# Patient Record
Sex: Female | Born: 1975 | Race: White | Hispanic: No | Marital: Married | Smoking: Never smoker
Health system: Southern US, Community
[De-identification: ages and names within clinical notes are randomized; demographics above are authoritative.]

## PROBLEM LIST (undated history)

## (undated) DIAGNOSIS — G8929 Other chronic pain: Secondary | ICD-10-CM

## (undated) DIAGNOSIS — R39198 Other difficulties with micturition: Secondary | ICD-10-CM

## (undated) DIAGNOSIS — Z8659 Personal history of other mental and behavioral disorders: Secondary | ICD-10-CM

## (undated) DIAGNOSIS — M549 Dorsalgia, unspecified: Secondary | ICD-10-CM

## (undated) DIAGNOSIS — E785 Hyperlipidemia, unspecified: Secondary | ICD-10-CM

## (undated) HISTORY — PX: WISDOM TOOTH EXTRACTION: SHX21

## (undated) HISTORY — PX: COLONOSCOPY: SHX174

## (undated) HISTORY — DX: Hyperlipidemia, unspecified: E78.5

## (undated) HISTORY — PX: SPINE SURGERY: SHX786

---

## 2003-12-17 ENCOUNTER — Ambulatory Visit (HOSPITAL_COMMUNITY): Admission: RE | Admit: 2003-12-17 | Discharge: 2003-12-17 | Payer: Self-pay | Admitting: Emergency Medicine

## 2003-12-17 IMAGING — CR DG KNEE COMPLETE 4+V*L*
4 series · 4 of 4 positions shown · non-contrast
Comparison: none

CLINICAL DATA: Left leg pain.  No injury. 
 LEFT KNEE FOUR VIEWS
 Four views of the left knee were obtained.  There is some loss of joint space medially.  No fracture is seen and no effusion is noted.
 IMPRESSION
 Some loss of joint space medially.  No acute abnormality.
 LEFT TIBIA/FIBULA TWO VIEWS
 Two views of the left tibia and fibula show no acute abnormality.  Alignment is normal.
 Negative left tibia/fibula.

[view not recorded (1 of 4)]
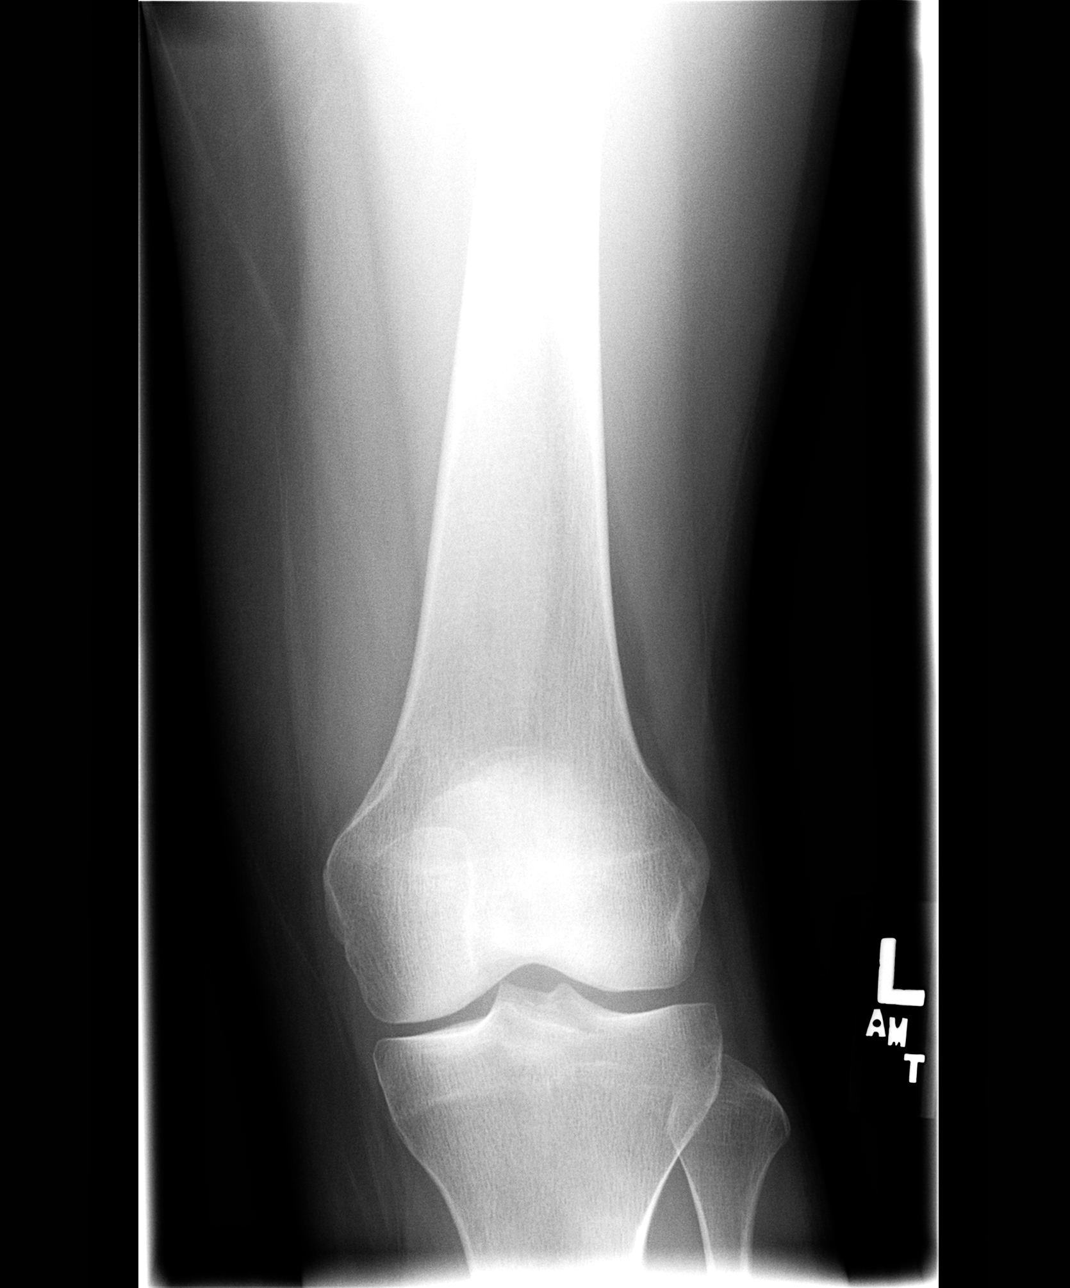

[view not recorded (2 of 4)]
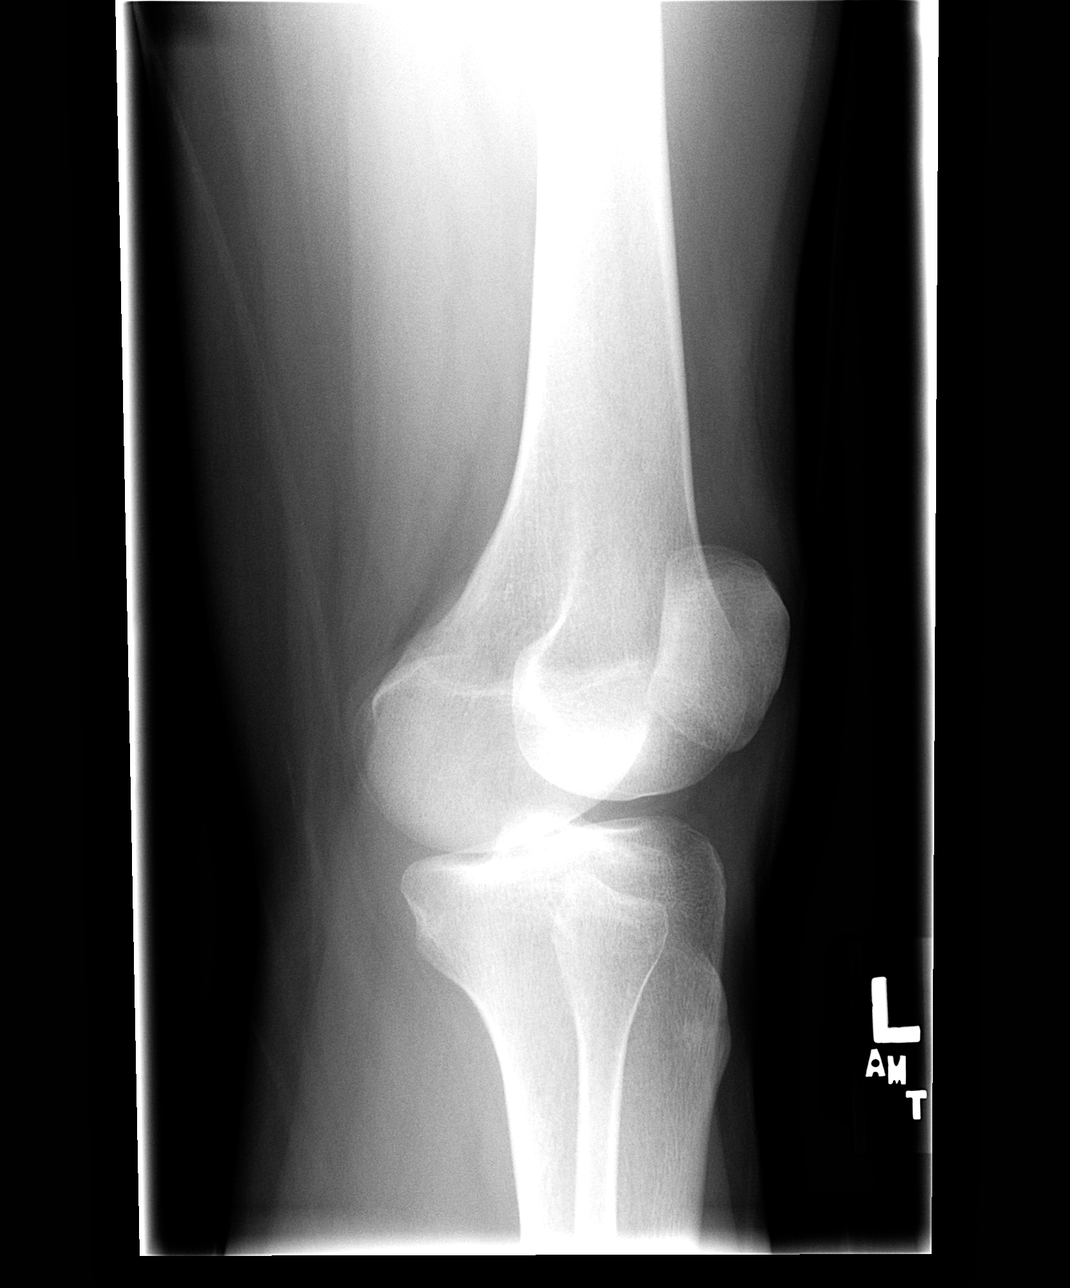

[view not recorded (3 of 4)]
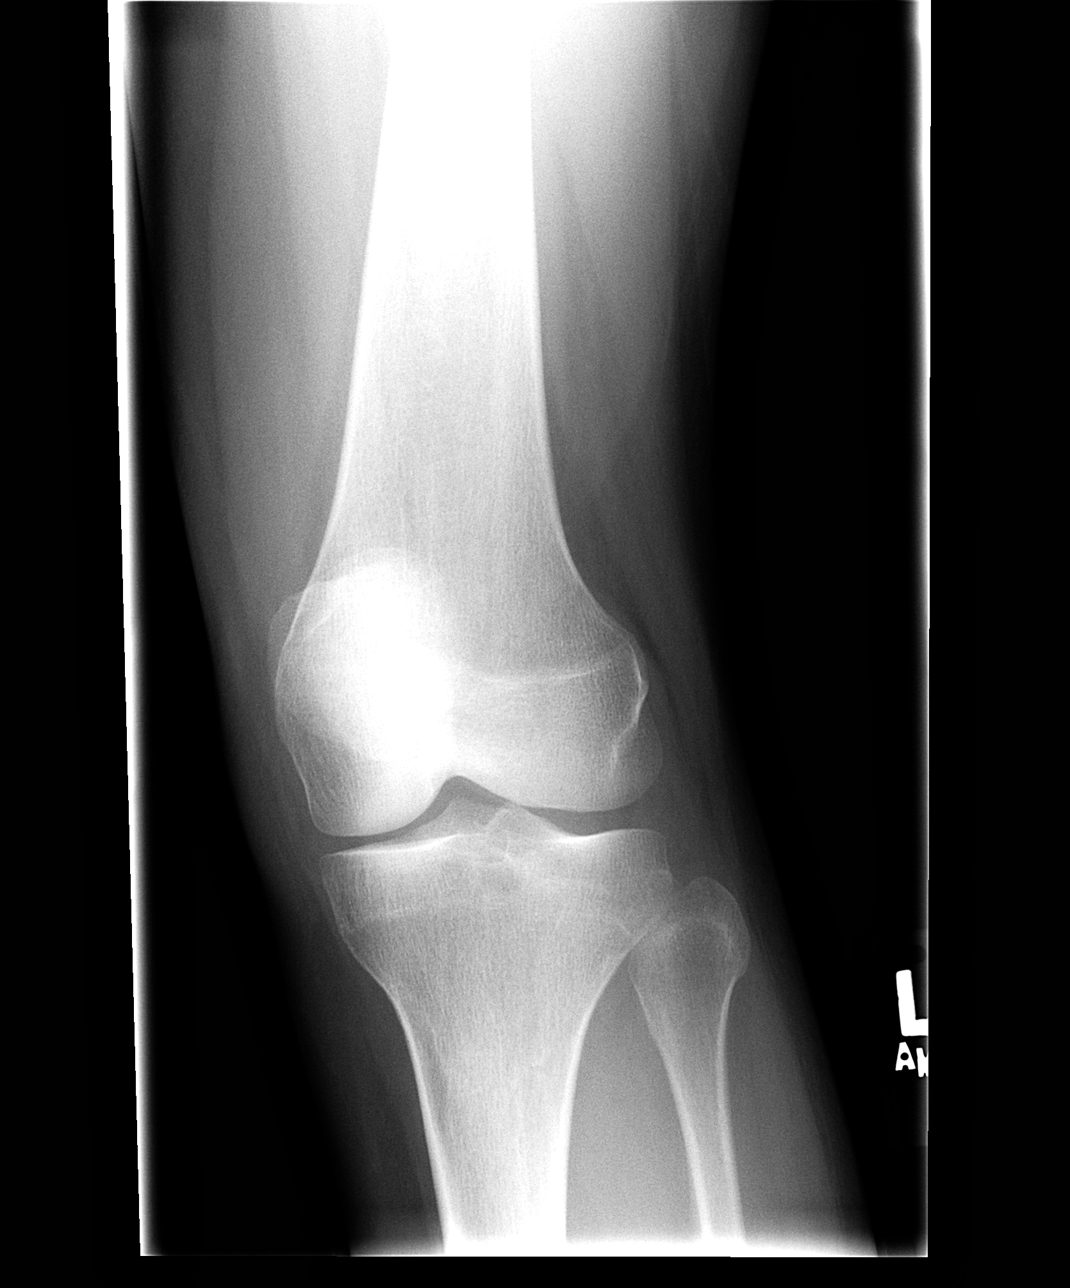

[view not recorded (4 of 4)]
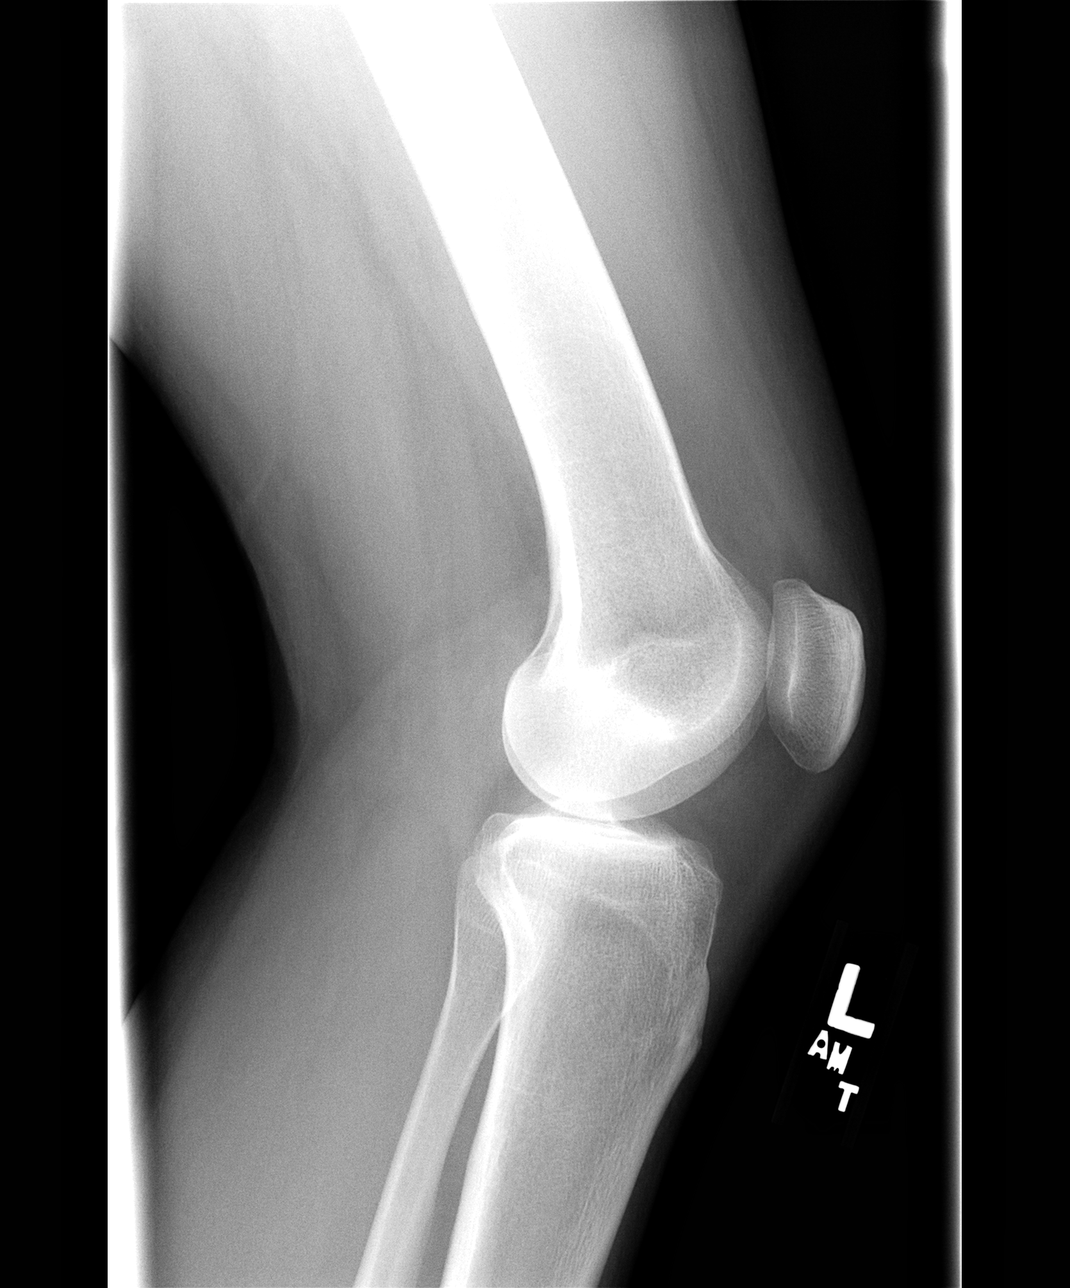

[4 of 4 positions shown; findings below may reference images not displayed]

## 2003-12-17 IMAGING — CR DG TIBIA/FIBULA 2V*L*
2 series · 2 of 2 positions shown · non-contrast
Comparison: none

CLINICAL DATA: Left leg pain.  No injury. 
 LEFT KNEE FOUR VIEWS
 Four views of the left knee were obtained.  There is some loss of joint space medially.  No fracture is seen and no effusion is noted.
 IMPRESSION
 Some loss of joint space medially.  No acute abnormality.
 LEFT TIBIA/FIBULA TWO VIEWS
 Two views of the left tibia and fibula show no acute abnormality.  Alignment is normal.
 Negative left tibia/fibula.

[view not recorded (1 of 2)]
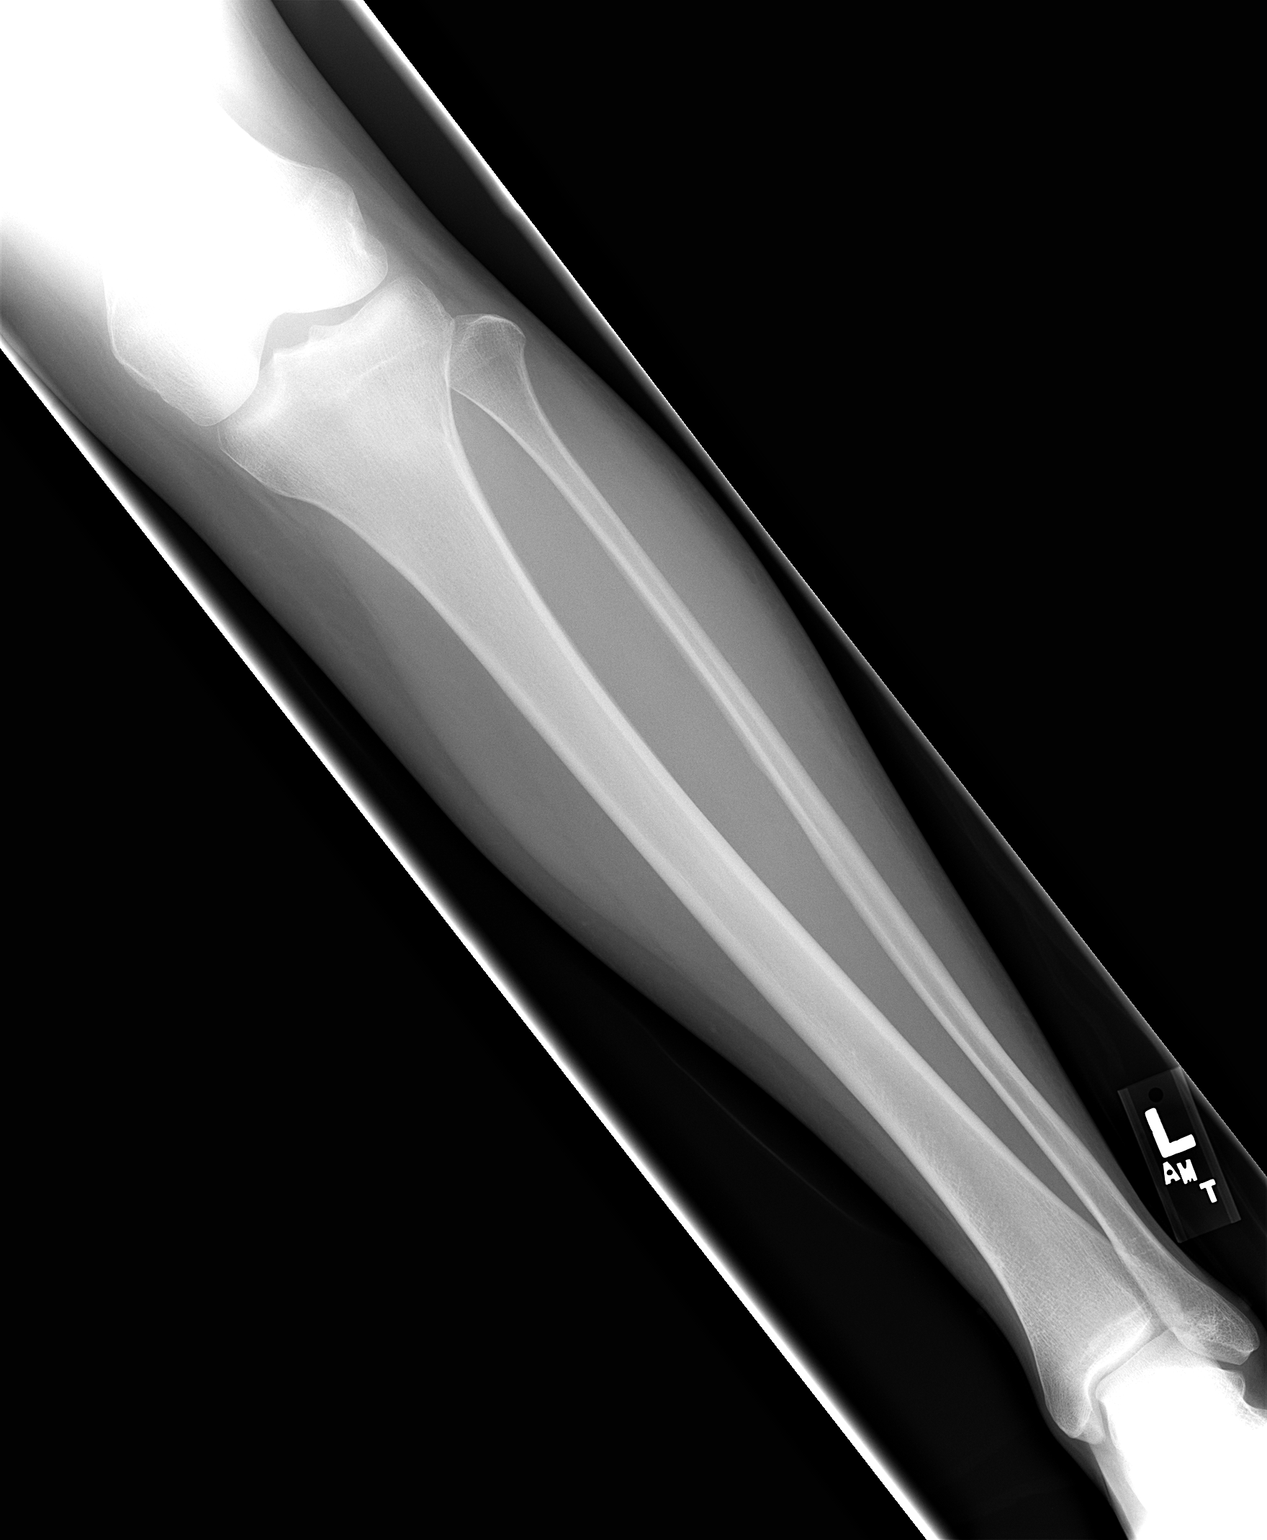

[view not recorded (2 of 2)]
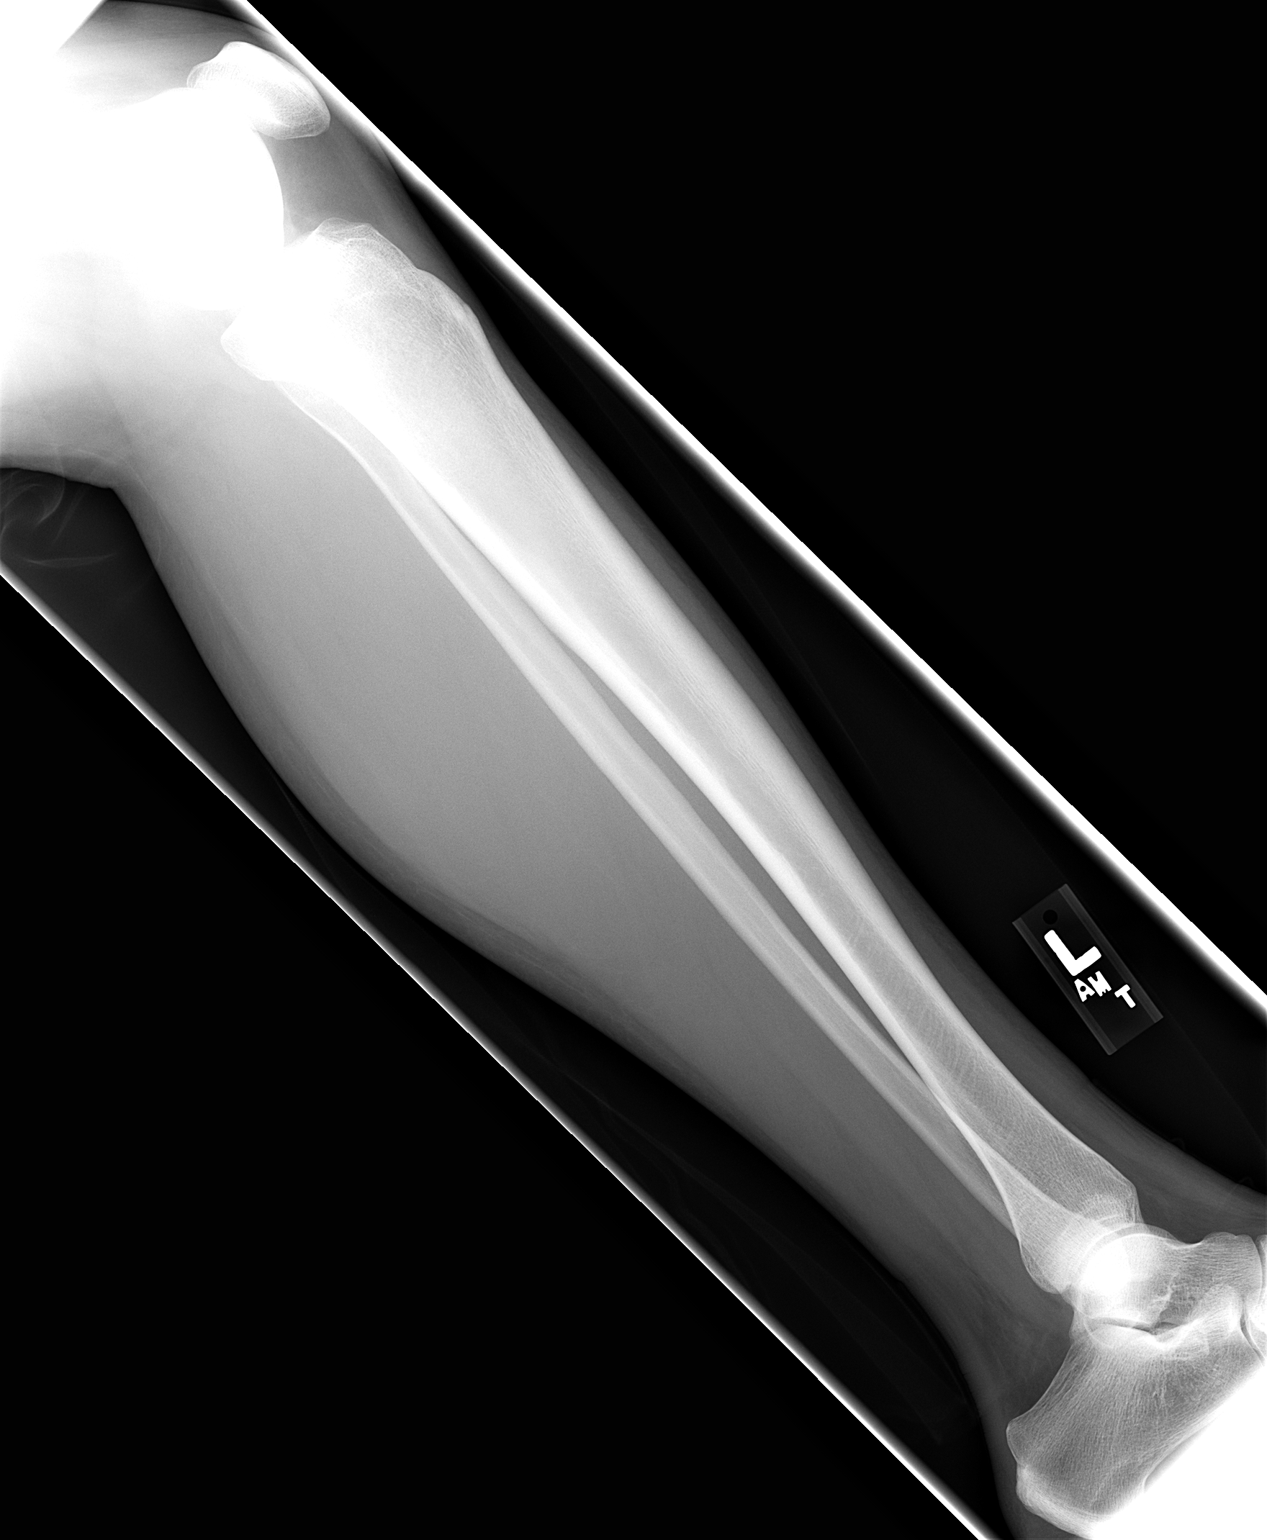

[2 of 2 positions shown; findings below may reference images not displayed]

## 2006-07-26 HISTORY — PX: CYSTECTOMY: SUR359

## 2008-03-26 ENCOUNTER — Encounter: Admission: RE | Admit: 2008-03-26 | Discharge: 2008-03-26 | Payer: Self-pay | Admitting: Gastroenterology

## 2008-03-26 IMAGING — CT CT ABDOMEN W/ CM
2 of 5 series · 17 of 46 positions shown, 19 images · IV contrast (READICAT/WATER & [ID] OMNI 300)
Comparison: None

CT ABDOMEN

CLINICAL DATA: Abdominal pain

CT ABDOMEN AND PELVIS WITH CONTRAST
TECHNIQUE: Multidetector CT imaging of the abdomen and pelvis was
performed using the standard protocol following bolus
administration of intravenous contrast.
Contrast: 100 ml of omni 300

[Series 3: routine abdomen · axial · 0.70mm/px · z∈[-342,+18]mm · 14 of 81 slices shown, 16 images]
[im 5/81  soft-tissue]
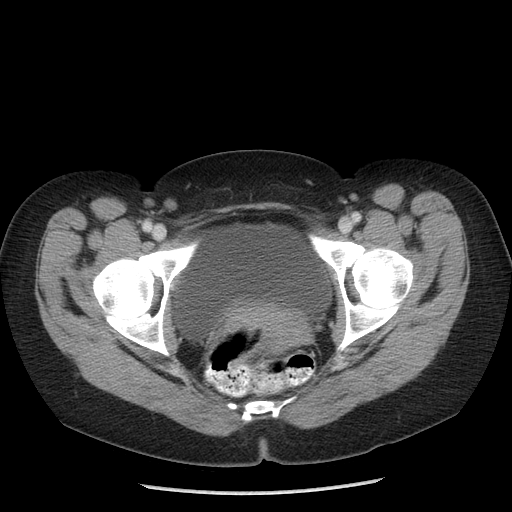
[im 5/81  bone]
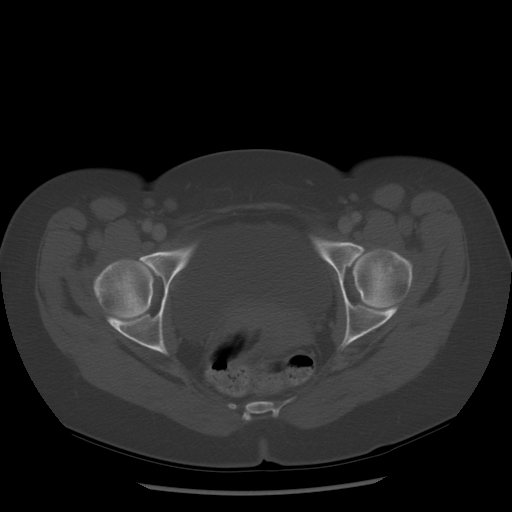
[im 9/81  soft-tissue]
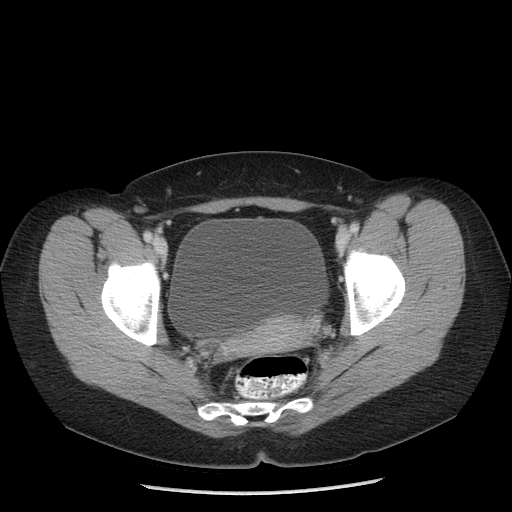
[im 17/81  soft-tissue]
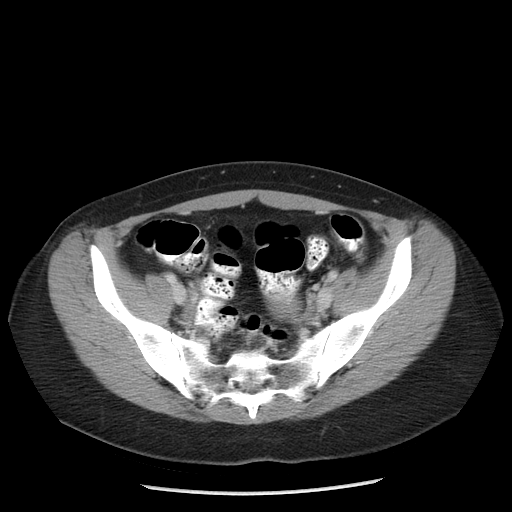
[im 22/81  soft-tissue]
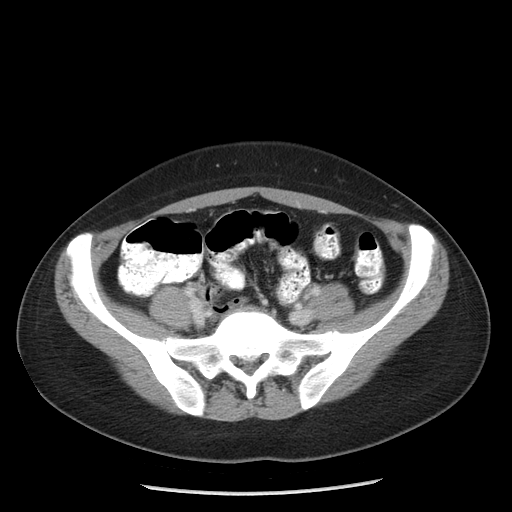
[im 26/81  soft-tissue]
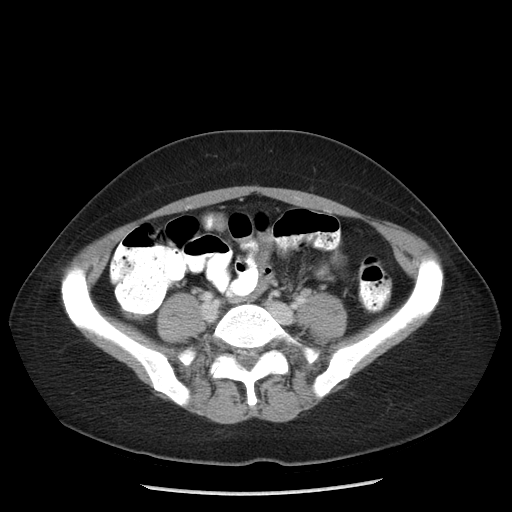
[im 34/81  soft-tissue]
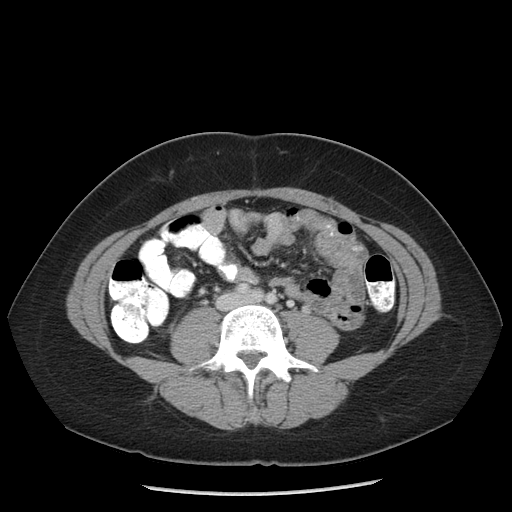
[im 38/81  soft-tissue]
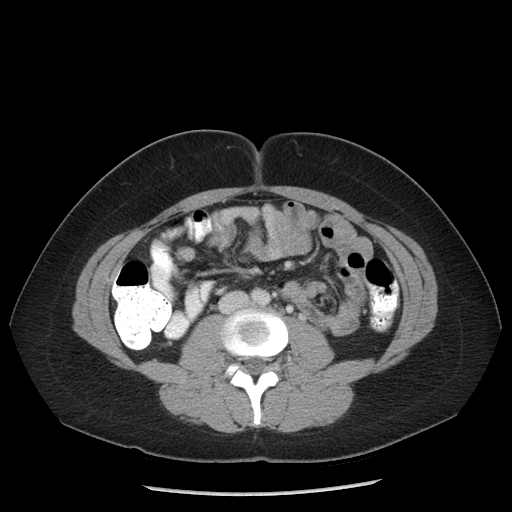
[im 43/81  soft-tissue]
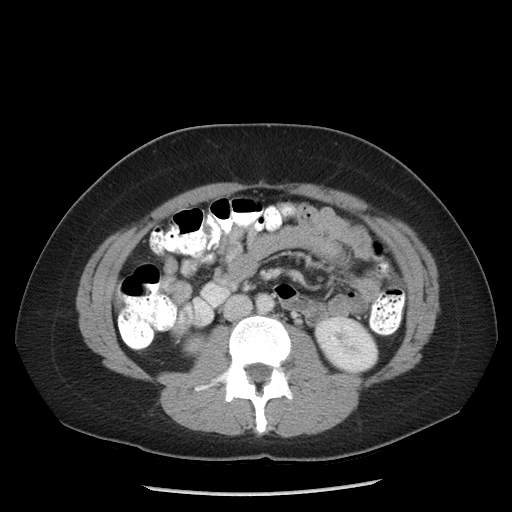
[im 47/81  soft-tissue]
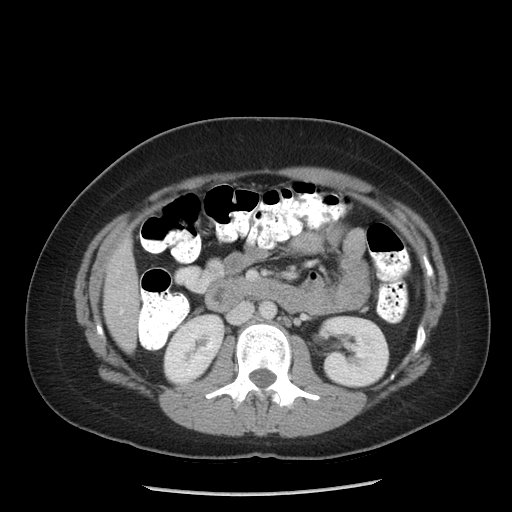
[im 47/81  bone]
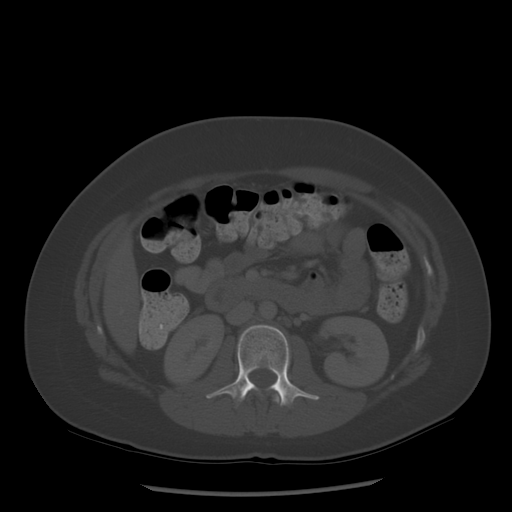
[im 55/81  soft-tissue]
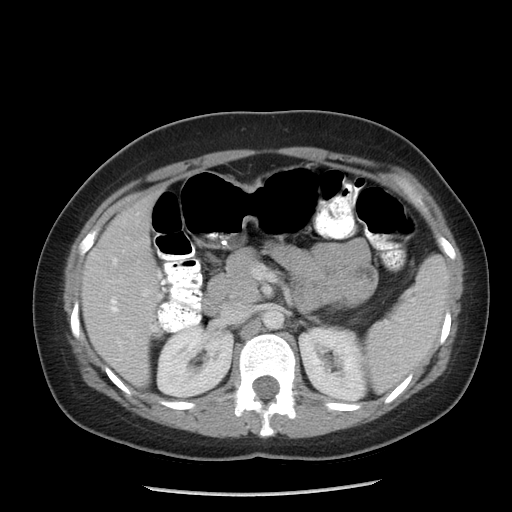
[im 59/81  soft-tissue]
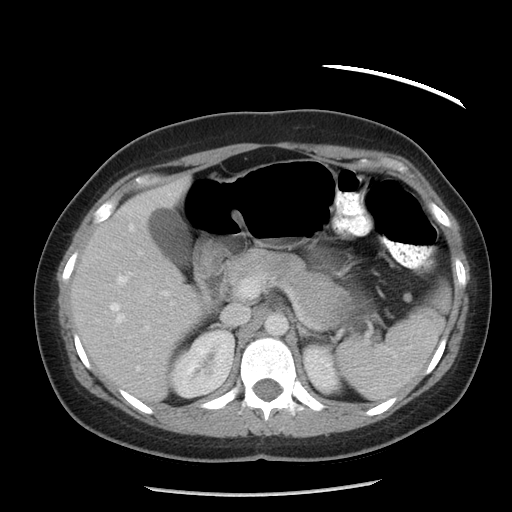
[im 64/81  soft-tissue]
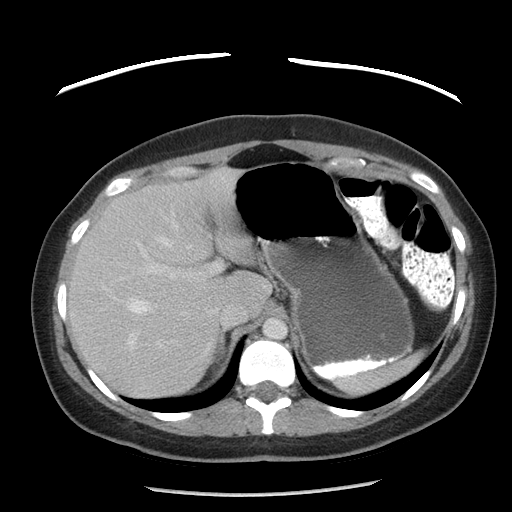
[im 72/81  soft-tissue]
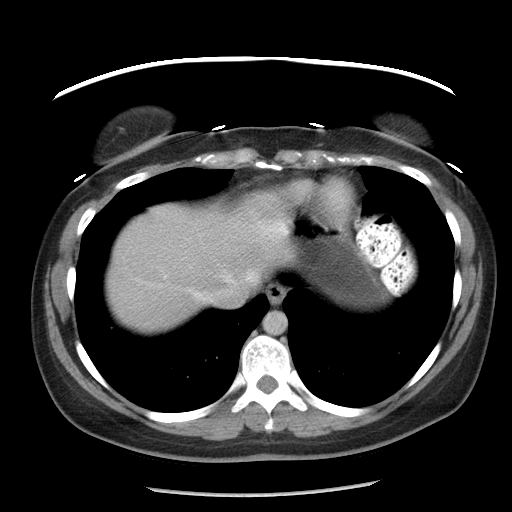
[im 76/81  soft-tissue]
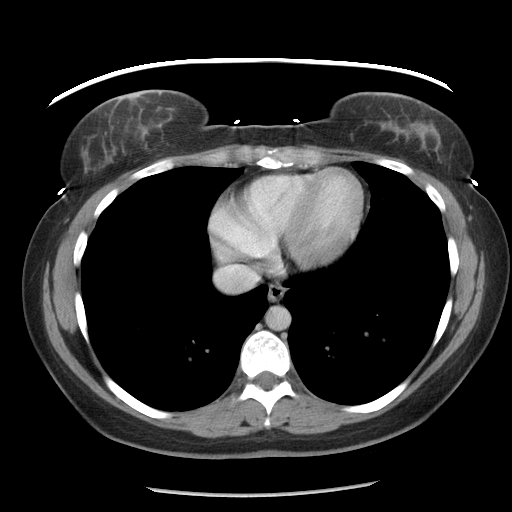

[Series 602: sagittal body · sagittal · 0.93mm/px · 3 of 145 slices shown]
[im 49/145  soft-tissue]
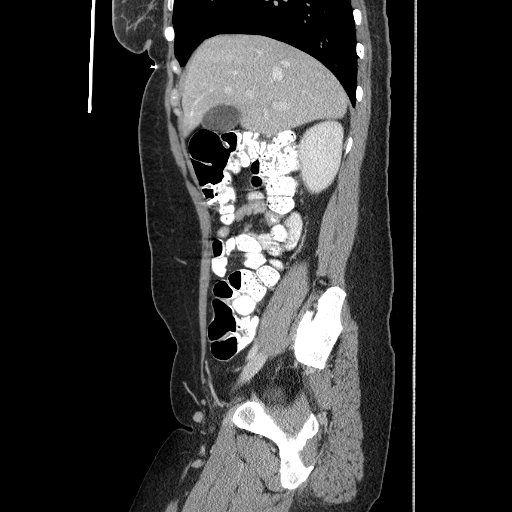
[im 65/145  soft-tissue]
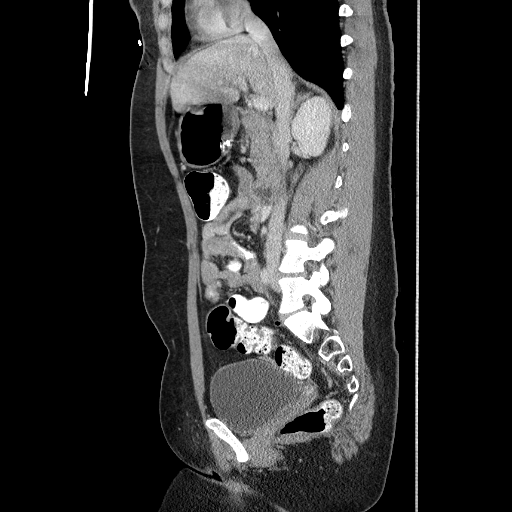
[im 81/145  soft-tissue]
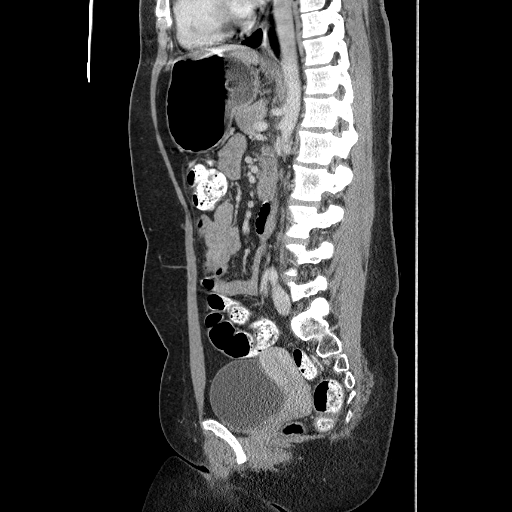

[17 of 46 positions shown; findings below may reference images not displayed]

FINDINGS: Visualized lung bases are clear.

Liver is normal.

Spleen is normal.

The pancreas is normal.

The adrenal glands are normal.

There is a simple appearing cyst within the right kidney measuring
9 mm, image 32.  A nonobstructing stone is identified within the
upper pole the left kidney measuring 3 mm, image 29.

Gallbladder is unremarkable by CT.

No biliary ductal dilation.

Stomach and visualized large and small bowel are unremarkable.

Abdominal aorta normal is in caliber.

No significant lymphadenopathy.

No free fluid or abnormal fluid collections..
IMPRESSION: 1.  No acute upper abdominal CT findings.
2.  Nonobstructing left renal calculi.

CT PELVIS
FINDINGS: Appendix identified and normal.

Visualized colon and small bowel are unremarkable.

No free fluid or abnormal fluid collections.

No significant lymphadenopathy.

Urinary bladder is normal.

There is no evidence for bowel obstruction.

A normal amount of stool is seen within the colon and rectum
without specific features to suggest constipation.
IMPRESSION: 1.  Normal pelvis CT.

## 2008-08-29 ENCOUNTER — Encounter (INDEPENDENT_AMBULATORY_CARE_PROVIDER_SITE_OTHER): Payer: Self-pay | Admitting: General Surgery

## 2008-08-29 ENCOUNTER — Ambulatory Visit (HOSPITAL_BASED_OUTPATIENT_CLINIC_OR_DEPARTMENT_OTHER): Admission: RE | Admit: 2008-08-29 | Discharge: 2008-08-29 | Payer: Self-pay | Admitting: General Surgery

## 2009-07-02 ENCOUNTER — Inpatient Hospital Stay (HOSPITAL_COMMUNITY): Admission: RE | Admit: 2009-07-02 | Discharge: 2009-07-04 | Payer: Self-pay | Admitting: Obstetrics and Gynecology

## 2010-10-27 LAB — COMPREHENSIVE METABOLIC PANEL
ALT: 10 U/L (ref 0–35)
ALT: 12 U/L (ref 0–35)
AST: 29 U/L (ref 0–37)
Albumin: 1.9 g/dL — ABNORMAL LOW (ref 3.5–5.2)
Alkaline Phosphatase: 106 U/L (ref 39–117)
BUN: 3 mg/dL — ABNORMAL LOW (ref 6–23)
CO2: 19 mEq/L (ref 19–32)
CO2: 24 mEq/L (ref 19–32)
Calcium: 8 mg/dL — ABNORMAL LOW (ref 8.4–10.5)
Calcium: 8.8 mg/dL (ref 8.4–10.5)
Chloride: 99 mEq/L (ref 96–112)
Creatinine, Ser: 0.48 mg/dL (ref 0.4–1.2)
GFR calc non Af Amer: >60 mL/min (ref >60)
Glucose, Bld: 96 mg/dL (ref 70–99)
Sodium: 130 mEq/L — ABNORMAL LOW (ref 135–145)
Sodium: 132 mEq/L — ABNORMAL LOW (ref 135–145)
Total Bilirubin: 0.6 mg/dL (ref 0.3–1.2)
Total Protein: 6.7 g/dL (ref 6.0–8.3)

## 2010-10-27 LAB — URINALYSIS, ROUTINE W REFLEX MICROSCOPIC
Bilirubin Urine: NEGATIVE
Protein, ur: NEGATIVE mg/dL
pH: 7 (ref 5.0–8.0)

## 2010-10-27 LAB — CBC
HCT: 25 % — ABNORMAL LOW (ref 36.0–46.0)
MCHC: 34 g/dL (ref 30.0–36.0)
MCHC: 34.2 g/dL (ref 30.0–36.0)
MCV: 90.5 fL (ref 78.0–100.0)
Platelets: 157 10*3/uL (ref 150–400)
Platelets: 217 10*3/uL (ref 150–400)
RBC: 2.73 MIL/uL — ABNORMAL LOW (ref 3.87–5.11)
RBC: 4.32 MIL/uL (ref 3.87–5.11)

## 2010-10-27 LAB — URINE MICROSCOPIC-ADD ON

## 2010-10-27 LAB — LACTATE DEHYDROGENASE: LDH: 158 U/L (ref 94–250)

## 2010-11-10 LAB — POCT HEMOGLOBIN-HEMACUE: Hemoglobin: 15.2 g/dL — ABNORMAL HIGH (ref 12.0–15.0)

## 2010-12-08 NOTE — Op Note (Signed)
NAME:  CURTIS, URIARTE                  ACCOUNT NO.:  [id number]   MEDICAL RECORD NO.:  [id number]          PATIENT TYPE:  AMB   LOCATION:  DSC                          FACILITY:  [facility]   PHYSICIAN:  Juanetta Gosling, MDDATE OF BIRTH:  03-Mar-1976   DATE OF PROCEDURE:  DATE OF DISCHARGE:                               OPERATIVE REPORT   PREOPERATIVE DIAGNOSIS:  Right perineal sebaceous cyst.   POSTOPERATIVE DIAGNOSIS:  Right perineal sebaceous cyst.   PROCEDURE:  Right perineal sebaceous cyst, excisional biopsy.   SURGEON:  Troy Sine. Dwain Sarna, MD.   ASSISTANT:  None.   ANESTHESIA:  General.   SPECIMENS:  Sebaceous cyst and surrounding tissue to Pathology.   ESTIMATED BLOOD LOSS:  Minimal.   COMPLICATIONS:  None.   DRAINS:  None.   DISPOSITION:  To recovery room in stable condition.   Ms. 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  She is referred from Dr. Elmon Else for a surgical excision.  On exam of her perineum, she had  evidence of prior infected sebaceous cyst of about 1.5 cm size mass.  We  discussed an excisional biopsy in the operating room.   DESCRIPTION OF PROCEDURE:  After informed consent was obtained, the  patient was then taken to the operating room.  She and I both identified  this area prior to operation.  She did state it increased considerably  since the last visit, but we were both able to feel a several millimeter  nodule next to a prior incision, so we did decide to proceed with a  surgery today.  She was placed under general anesthesia without  complication.  Her right perineum was then prepped and draped in a  standard sterile surgical fashion.  A surgical timeout was then  performed.  An elliptical incision overlying the mass as well as encompassing her  prior incision was then made and the tissue was  removed down to the  level of her muscle.  All tissue that felt like was indurated or felt  like a cyst was removed and upon completion all of the tissue  surrounding this was completely normal.  Irrigation was performed.  Hemostasis was observed.  I closed the dermis with 4-0 Vicryl and then  skin with 4-0 Monocryl.  Steri-Strips were placed over this.  I  infiltrated the wound with 0.25% Marcaine following this and then a  dressing was placed.  She was awakened in the operating room and  transferred to the recovery room in stable condition.      Juanetta Gosling, MD  Electronically Signed     MCW/MEDQ  D:  08/29/2008  T:  08/29/2008  Job:  [id number]   cc:   Christianne Dolin, MD

## 2011-02-09 LAB — RUBELLA ANTIBODY, IGM: Rubella: IMMUNE

## 2011-02-09 LAB — ABO/RH: RH Type: POSITIVE

## 2011-02-09 LAB — ANTIBODY SCREEN: Antibody Screen: NEGATIVE

## 2011-02-23 LAB — GC/CHLAMYDIA PROBE AMP, GENITAL
Chlamydia: NEGATIVE
Gonorrhea: NEGATIVE

## 2011-07-27 NOTE — L&D Delivery Note (Signed)
Delivery Note At 12:15 AM a viable female was delivered via VBAC, Spontaneous (Presentation: ; Occiput Anterior).  APGAR: 8, 9; weight 6 lb 11.9 oz (3060 g).   Placenta status: Intact, Spontaneous.  Cord: 3 vessels with the following complications: None.    Anesthesia: Epidural  Episiotomy: none Lacerations: 2nd degree;Perineal Suture Repair: 2.0 vicryl rapide Est. Blood Loss (mL): 350 cc  Mom to postpartum.  Baby to nursery-stable.  Zayquan Bogard A. 09/05/2011, 12:35 AM

## 2011-09-04 ENCOUNTER — Encounter (HOSPITAL_COMMUNITY): Payer: Self-pay

## 2011-09-04 ENCOUNTER — Inpatient Hospital Stay (HOSPITAL_COMMUNITY)
Admission: AD | Admit: 2011-09-04 | Discharge: 2011-09-04 | Disposition: A | Discharge status: Home / Self Care | Payer: BC Managed Care – PPO | Source: Ambulatory Visit | Attending: Obstetrics | Admitting: Obstetrics

## 2011-09-04 ENCOUNTER — Inpatient Hospital Stay (HOSPITAL_COMMUNITY)
Admission: AD | Admit: 2011-09-04 | Discharge: 2011-09-06 | DRG: 373 | Disposition: A | Discharge status: Home / Self Care | Payer: BC Managed Care – PPO | Source: Ambulatory Visit | Attending: Obstetrics | Admitting: Obstetrics

## 2011-09-04 ENCOUNTER — Encounter (HOSPITAL_COMMUNITY): Payer: Self-pay | Admitting: Anesthesiology

## 2011-09-04 ENCOUNTER — Inpatient Hospital Stay (HOSPITAL_COMMUNITY): Payer: BC Managed Care – PPO | Admitting: Anesthesiology

## 2011-09-04 DIAGNOSIS — Z22330 Carrier of Group B streptococcus: Secondary | ICD-10-CM

## 2011-09-04 DIAGNOSIS — O479 False labor, unspecified: Secondary | ICD-10-CM | POA: Insufficient documentation

## 2011-09-04 DIAGNOSIS — O34219 Maternal care for unspecified type scar from previous cesarean delivery: Secondary | ICD-10-CM | POA: Diagnosis present

## 2011-09-04 DIAGNOSIS — O99892 Other specified diseases and conditions complicating childbirth: Secondary | ICD-10-CM | POA: Diagnosis present

## 2011-09-04 DIAGNOSIS — O09529 Supervision of elderly multigravida, unspecified trimester: Secondary | ICD-10-CM | POA: Diagnosis present

## 2011-09-04 LAB — COMPREHENSIVE METABOLIC PANEL
ALT: 10 U/L (ref 0–35)
AST: 17 U/L (ref 0–37)
Albumin: 2.9 g/dL — ABNORMAL LOW (ref 3.5–5.2)
Alkaline Phosphatase: 141 U/L — ABNORMAL HIGH (ref 39–117)
BUN: 4 mg/dL — ABNORMAL LOW (ref 6–23)
CO2: 20 mEq/L (ref 19–32)
Calcium: 8.5 mg/dL (ref 8.4–10.5)
Chloride: 100 mEq/L (ref 96–112)
Creatinine, Ser: 0.54 mg/dL (ref 0.50–1.10)
GFR calc Af Amer: >90 mL/min (ref >90)
GFR calc non Af Amer: >90 mL/min (ref >90)
Glucose, Bld: 80 mg/dL (ref 70–99)
Potassium: 3.6 mEq/L (ref 3.5–5.1)
Sodium: 131 mEq/L — ABNORMAL LOW (ref 135–145)
Total Bilirubin: 0.3 mg/dL (ref 0.3–1.2)
Total Protein: 6.7 g/dL (ref 6.0–8.3)

## 2011-09-04 LAB — CBC
HCT: 38.9 % (ref 36.0–46.0)
Hemoglobin: 13.4 g/dL (ref 12.0–15.0)
MCH: 29.4 pg (ref 26.0–34.0)
MCHC: 34.4 g/dL (ref 30.0–36.0)
MCV: 85.3 fL (ref 78.0–100.0)
Platelets: 194 10*3/uL (ref 150–400)
RBC: 4.56 MIL/uL (ref 3.87–5.11)
RDW: 13.4 % (ref 11.5–15.5)
WBC: 12.4 10*3/uL — ABNORMAL HIGH (ref 4.0–10.5)

## 2011-09-04 LAB — LACTATE DEHYDROGENASE: LDH: 165 U/L (ref 94–250)

## 2011-09-04 LAB — URIC ACID: Uric Acid, Serum: 5.2 mg/dL (ref 2.4–7.0)

## 2011-09-04 MED ORDER — CEFAZOLIN SODIUM-DEXTROSE 2-3 GM-% IV SOLR
2.0000 g | Freq: Once | INTRAVENOUS | Status: AC
  Administered 2011-09-04: 2 g via INTRAVENOUS
  Filled 2011-09-04: qty 50

## 2011-09-04 MED ORDER — ACETAMINOPHEN 325 MG PO TABS: 650.0000 mg | ORAL_TABLET | ORAL | Status: DC | PRN

## 2011-09-04 MED ORDER — IBUPROFEN 600 MG PO TABS: 600.0000 mg | ORAL_TABLET | Freq: Four times a day (QID) | ORAL | Status: DC | PRN

## 2011-09-04 MED ORDER — NALBUPHINE SYRINGE 5 MG/0.5 ML: 5.0000 mg | INJECTION | INTRAMUSCULAR | Status: DC

## 2011-09-04 MED ORDER — CEFAZOLIN SODIUM 1-5 GM-% IV SOLN
1.0000 g | Freq: Four times a day (QID) | INTRAVENOUS | Status: DC
  Filled 2011-09-04 (×2): qty 50

## 2011-09-04 MED ORDER — LIDOCAINE HCL (PF) 1 % IJ SOLN
INTRAMUSCULAR | Status: DC | PRN
  Administered 2011-09-04 (×3): 4 mL
  Administered 2011-09-05: 30 mL

## 2011-09-04 MED ORDER — LIDOCAINE HCL (PF) 1 % IJ SOLN
30.0000 mL | INTRAMUSCULAR | Status: DC | PRN
  Filled 2011-09-04: qty 30

## 2011-09-04 MED ORDER — LACTATED RINGERS IV SOLN
500.0000 mL | Freq: Once | INTRAVENOUS | Status: AC
  Administered 2011-09-04: 500 mL via INTRAVENOUS

## 2011-09-04 MED ORDER — CITRIC ACID-SODIUM CITRATE 334-500 MG/5ML PO SOLN: 30.0000 mL | ORAL | Status: DC | PRN

## 2011-09-04 MED ORDER — LACTATED RINGERS IV SOLN
500.0000 mL | INTRAVENOUS | Status: DC | PRN
  Administered 2011-09-04: 1000 mL via INTRAVENOUS

## 2011-09-04 MED ORDER — EPHEDRINE 5 MG/ML INJ
10.0000 mg | INTRAVENOUS | Status: DC | PRN
  Filled 2011-09-04: qty 4

## 2011-09-04 MED ORDER — PHENYLEPHRINE 40 MCG/ML (10ML) SYRINGE FOR IV PUSH (FOR BLOOD PRESSURE SUPPORT): 80.0000 ug | PREFILLED_SYRINGE | INTRAVENOUS | Status: DC | PRN

## 2011-09-04 MED ORDER — FENTANYL 2.5 MCG/ML BUPIVACAINE 1/10 % EPIDURAL INFUSION (WH - ANES)
14.0000 mL/h | INTRAMUSCULAR | Status: DC
  Administered 2011-09-04: 14 mL/h via EPIDURAL
  Filled 2011-09-04: qty 60

## 2011-09-04 MED ORDER — LACTATED RINGERS IV SOLN
INTRAVENOUS | Status: DC
  Administered 2011-09-04: 125 mL/h via INTRAVENOUS

## 2011-09-04 MED ORDER — DIPHENHYDRAMINE HCL 50 MG/ML IJ SOLN: 12.5000 mg | INTRAMUSCULAR | Status: DC | PRN

## 2011-09-04 MED ORDER — OXYTOCIN 20 UNITS IN LACTATED RINGERS INFUSION - SIMPLE
INTRAVENOUS | Status: AC
  Administered 2011-09-05: 20 [IU] via INTRAVENOUS
  Filled 2011-09-04: qty 1000

## 2011-09-04 MED ORDER — PHENYLEPHRINE 40 MCG/ML (10ML) SYRINGE FOR IV PUSH (FOR BLOOD PRESSURE SUPPORT)
80.0000 ug | PREFILLED_SYRINGE | INTRAVENOUS | Status: DC | PRN
  Filled 2011-09-04: qty 5

## 2011-09-04 MED ORDER — EPHEDRINE 5 MG/ML INJ: 10.0000 mg | INTRAVENOUS | Status: DC | PRN

## 2011-09-04 NOTE — Progress Notes (Signed)
Contractions since last night were regular now have spaced, VBAC GBS positive. G2P1

## 2011-09-04 NOTE — H&P (Signed)
  OB ADMISSION/ HISTORY & PHYSICAL:  Admission Date: 09/04/2011  7:41 PM  Admit Diagnosis: Active labor / previous CS for TOLAC / + GBS  Anna Tran is a 36 y.o. female presenting for active labor. Desires TOLAC. Requesting epidural for pain management at this time.  Prenatal History: G2P1001   EDC : 09/19/2011, Alternate EDD Entry  Prenatal care at [facility] Primary Ob provider - Dr Ernestina Penna  Prenatal course complicated by + GBS bacturia and history of previous CS for arrest of descent (2hr second stage with persistent OP with 8 pound birth weight)  Prenatal Labs: ABO, Rh:   O positive Antibody:  negative Rubella:   Immune RPR:   NR HBsAg:   Negative HIV:   NR GBS:   + per urine cx x 2 1 hr Glucola : normal   Medical / Surgical History :  Past medical history:  Past Medical History  Diagnosis Date  . Hemorrhoids      Past surgical history:  Past Surgical History  Procedure Date  . Cystectomy   . Wisdom tooth extraction   . Cesarean section     FTP     Family History:  Family History  Problem Relation Age of Onset  . Anesthesia problems Neg Hx      Social History:  reports that she has never smoked. She has never used smokeless tobacco. She reports that she does not drink alcohol or use illicit drugs.   Allergies: Amoxicillin and Sulfa antibiotics  - unsure reaction to amoxicillin (? Itching possible rash with mono) / + severe rash with sulfa / Keflex and z-pak in recent years without reaction.   Current Medications at time of admission:  No current facility-administered medications for this encounter.    Review of Systems: Ctx worse in past 3 hours - now every 5 minutes + show No LOF No PIH signs - no headache / vision changes / no epigastric pain  Physical Exam:  VS : 97.5 - 81- 20 - 134/85 and 137/92  Dilation: 5.5 Effacement (%): 90 Station: +1 Exam by:: TBailey, CNM  General: uncomfortable with ctx / painful  ctx Heart:RR Lungs: labored with ctx Abdomen: soft non-tender / uterus firm with ctx with relaxed tone between ctx Extremities: no edema Genitalia / VE: 5-6 cm / 95% / vtx / +1 / BBOW / + show  FHR: 140  / moderate variability / + accels / Decels-none TOCO: every 2-4 minutes / strong / lasting 60-80 sec     Assessment: 37 6/7 weeks - active labor Previous CS - desires TOLAC + GBS (new documentation of ? Allergy to amoxicillin - no sensitivities done on GBS culture)  Plan:  Admit Consult with Dr Ernestina Penna - plan ancef IV abx for GBS prophylaxis (no reaction with Keflex and only questionable mild reaction to amoxicillin) Epidural Check baseline PIH labs  Dr Ernestina Penna will follow in labor  BAILEY,TANYA 09/04/2011, 8:30 PM

## 2011-09-04 NOTE — Anesthesia Preprocedure Evaluation (Signed)
Anesthesia Evaluation  Patient identified by MRN, date of birth, ID band Patient awake    Reviewed: Allergy & Precautions, H&P , NPO status , Patient's Chart, lab work & pertinent test results, reviewed documented beta blocker date and time   History of Anesthesia Complications Negative for: history of anesthetic complications  Airway Mallampati: II TM Distance: >3 FB Neck ROM: full    Dental  (+) Teeth Intact   Pulmonary neg pulmonary ROS,  clear to auscultation        Cardiovascular neg cardio ROS regular Normal    Neuro/Psych Negative Neurological ROS  Negative Psych ROS   GI/Hepatic negative GI ROS, Neg liver ROS,   Endo/Other  Negative Endocrine ROS  Renal/GU negative Renal ROS  Genitourinary negative   Musculoskeletal   Abdominal   Peds  Hematology negative hematology ROS (+)   Anesthesia Other Findings   Reproductive/Obstetrics (+) Pregnancy (h/o prior c/s, for TOLAC)                           Anesthesia Physical Anesthesia Plan  ASA: II  Anesthesia Plan: Epidural   Post-op Pain Management:    Induction:   Airway Management Planned:   Additional Equipment:   Intra-op Plan:   Post-operative Plan:   Informed Consent: I have reviewed the patients History and Physical, chart, labs and discussed the procedure including the risks, benefits and alternatives for the proposed anesthesia with the patient or authorized representative who has indicated his/her understanding and acceptance.     Plan Discussed with:   Anesthesia Plan Comments:         Anesthesia Quick Evaluation

## 2011-09-04 NOTE — Progress Notes (Signed)
Pt states ctx's this am from 4-5 q5 minutes lasting 45 seconds, started easing off. Have been irregular since then. +FM, denies lof, notes bloody show, was 1cm in office Wednesday, 50% effaced.

## 2011-09-04 NOTE — Anesthesia Procedure Notes (Signed)
Epidural Patient location during procedure: OB Start time: 09/04/2011 10:33 PM Reason for block: procedure for pain  Staffing Performed by: anesthesiologist   Preanesthetic Checklist Completed: patient identified, site marked, surgical consent, pre-op evaluation, timeout performed, IV checked, risks and benefits discussed and monitors and equipment checked  Epidural Patient position: sitting Prep: site prepped and draped and DuraPrep Patient monitoring: continuous pulse ox and blood pressure Approach: midline Injection technique: LOR air  Needle:  Needle type: Tuohy  Needle gauge: 17 G Needle length: 9 cm Needle insertion depth: 5 cm cm Catheter type: closed end flexible Catheter size: 19 Gauge Catheter at skin depth: 10 cm Test dose: negative  Assessment Events: blood not aspirated, injection not painful, no injection resistance, negative IV test and no paresthesia  Additional Notes Discussed risk of headache, infection, bleeding, nerve injury and failed or incomplete block.  Patient voices understanding and wishes to proceed.

## 2011-09-04 NOTE — Progress Notes (Signed)
Pt states ctx's now q3-65minutes, bloody show still noted, denies lof.

## 2011-09-04 NOTE — Progress Notes (Signed)
Awaiting epidural - anesthesia in PR with emergency case Offered IV analgesia until epidural available Does not want to be sedated - nubain 5 mg IV now  Marlinda Mike CNM

## 2011-09-04 NOTE — Progress Notes (Signed)
Pt states, " I was here earlier and was 3 cm, but now they are stronger and closer."

## 2011-09-04 NOTE — Progress Notes (Signed)
Returned from walking, still w/ ctx. Good Fm, no LOF, no VB  Filed Vitals:   09/04/11 1016  BP: 134/85  Temp: 97.5 F (36.4 C)  TempSrc: Oral  Resp: 16  Height: 5\' 5"  (1.651 m)  Weight: 77.021 kg (169 lb 12.8 oz)   Gen: well appearing, no distress Abd: gravid, NT Gu: no change 3/50/0 (per Wiliam Ke) Toco: q 5 min FH: 130's, + accels, no decels, 10 beat var  A/P: Latent labor. OK to d/c home, return w/ dec FM, ROM, bleeding, increased ctx, abd pain. VBAC attempt and uterine rupture precautions d/w pt who agrees. - GBS pos, need abx prior to delivery - reactive fetal testing.   Rustyn Conery A. 09/04/2011 1:04 PM

## 2011-09-04 NOTE — ED Notes (Signed)
TBailey, CNM notified pt in MAU for ctx's more intense, closer together q3-5 minutes, stronger, lasting longer. Will come see pt in MAU.

## 2011-09-04 NOTE — Progress Notes (Signed)
Pt now getting comfortable with epidural.  Filed Vitals:   09/04/11 2231 09/04/11 2235 09/04/11 2240 09/04/11 2245  BP: 143/83 132/81 123/75 137/82  Pulse: 92 82 85 87  Temp:      TempSrc:      Resp: 20 20 20 18   Height:      Weight:       Gen: well appearing GUL 7/100/-1. AROM clear Toco: not tracing well FH: 140's, + accels, no decels, 10 beat var  GBS pos  A/P: Attempting VBAC - ROM now, expectant management - FWB reassuring - GBS pos, on Ancef - Epidural  Amalio Loe A. 09/04/2011 10:56 PM

## 2011-09-04 NOTE — ED Notes (Signed)
TBailey, CNM in MAU, notified pt here for labor eval, prior C/S for FTP, planning VBAC, will see pt in MAU promptly

## 2011-09-04 NOTE — ED Provider Notes (Signed)
  S: Irregular ctx and cramping  all day yesterday - regular ctx by 0100 every 3-5 minutes Unable to sleep all night due to ctx - spaced out to nothing in past 2 hours + discharge brownish pink since 0300 No LOF + active FM Planning TOLAC / known +GBS bacturia this pregnancy Last exam - last week 1/50/vtx/-2 by Camila Li: 97.5 - 80-16-134/85 FHR cat 1 : baseline 140 / moderate variability / no decels Toco - UI with occasional ctx  Abdomen - soft and non-tender  Cervix anterior / soft / 3 cm / 90% / Vtx / -1 / BBOW / small brown discharge  A: Prodromal labor pattern Previous CS planning TOLAC + GBS bacturia this pregnancy  P:  Hydrate well with water in next hour with oral Ambulate in halls   Re-evaluate ctx and decide on admission  Dr Ernestina Penna - primary OB updated with status &pplan Update after re-evaluation

## 2011-09-05 ENCOUNTER — Encounter (HOSPITAL_COMMUNITY): Payer: Self-pay | Admitting: *Deleted

## 2011-09-05 LAB — CBC
HCT: 35 % — ABNORMAL LOW (ref 36.0–46.0)
Hemoglobin: 11.8 g/dL — ABNORMAL LOW (ref 12.0–15.0)
MCV: 86.6 fL (ref 78.0–100.0)
RBC: 4.04 MIL/uL (ref 3.87–5.11)
RDW: 13.1 % (ref 11.5–15.5)
WBC: 16 10*3/uL — ABNORMAL HIGH (ref 4.0–10.5)

## 2011-09-05 LAB — RPR: RPR Ser Ql: NONREACTIVE

## 2011-09-05 MED ORDER — TETANUS-DIPHTH-ACELL PERTUSSIS 5-2.5-18.5 LF-MCG/0.5 IM SUSP
0.5000 mL | Freq: Once | INTRAMUSCULAR | Status: AC
  Administered 2011-09-06: 0.5 mL via INTRAMUSCULAR
  Filled 2011-09-05: qty 0.5

## 2011-09-05 MED ORDER — DIPHENHYDRAMINE HCL 25 MG PO CAPS: 25.0000 mg | ORAL_CAPSULE | Freq: Four times a day (QID) | ORAL | Status: DC | PRN

## 2011-09-05 MED ORDER — BISACODYL 10 MG RE SUPP
10.0000 mg | Freq: Every day | RECTAL | Status: DC | PRN
  Filled 2011-09-05: qty 1

## 2011-09-05 MED ORDER — DIBUCAINE 1 % RE OINT
1.0000 "application " | TOPICAL_OINTMENT | RECTAL | Status: DC | PRN
  Filled 2011-09-05: qty 28

## 2011-09-05 MED ORDER — OXYCODONE-ACETAMINOPHEN 5-325 MG PO TABS
1.0000 | ORAL_TABLET | ORAL | Status: DC | PRN
  Administered 2011-09-05: 1 via ORAL
  Filled 2011-09-05: qty 1

## 2011-09-05 MED ORDER — SENNOSIDES-DOCUSATE SODIUM 8.6-50 MG PO TABS
2.0000 | ORAL_TABLET | Freq: Every day | ORAL | Status: DC
  Administered 2011-09-05: 2 via ORAL

## 2011-09-05 MED ORDER — FLEET ENEMA 7-19 GM/118ML RE ENEM: 1.0000 | ENEMA | Freq: Every day | RECTAL | Status: DC | PRN

## 2011-09-05 MED ORDER — BENZOCAINE-MENTHOL 20-0.5 % EX AERO
1.0000 "application " | INHALATION_SPRAY | CUTANEOUS | Status: DC | PRN
  Administered 2011-09-06: 1 via TOPICAL
  Filled 2011-09-05: qty 56

## 2011-09-05 MED ORDER — SIMETHICONE 80 MG PO CHEW: 80.0000 mg | CHEWABLE_TABLET | ORAL | Status: DC | PRN

## 2011-09-05 MED ORDER — LANOLIN HYDROUS EX OINT: TOPICAL_OINTMENT | CUTANEOUS | Status: DC | PRN

## 2011-09-05 MED ORDER — PRENATAL MULTIVITAMIN CH
1.0000 | ORAL_TABLET | Freq: Every day | ORAL | Status: DC
  Administered 2011-09-05 – 2011-09-06 (×2): 1 via ORAL
  Filled 2011-09-05 (×2): qty 1

## 2011-09-05 MED ORDER — ONDANSETRON HCL 4 MG PO TABS: 4.0000 mg | ORAL_TABLET | ORAL | Status: DC | PRN

## 2011-09-05 MED ORDER — WITCH HAZEL-GLYCERIN EX PADS: 1.0000 "application " | MEDICATED_PAD | CUTANEOUS | Status: DC | PRN

## 2011-09-05 MED ORDER — ZOLPIDEM TARTRATE 5 MG PO TABS: 5.0000 mg | ORAL_TABLET | Freq: Every evening | ORAL | Status: DC | PRN

## 2011-09-05 MED ORDER — ONDANSETRON HCL 4 MG/2ML IJ SOLN: 4.0000 mg | INTRAMUSCULAR | Status: DC | PRN

## 2011-09-05 MED ORDER — IBUPROFEN 600 MG PO TABS
600.0000 mg | ORAL_TABLET | Freq: Four times a day (QID) | ORAL | Status: DC
  Administered 2011-09-05 – 2011-09-06 (×6): 600 mg via ORAL
  Filled 2011-09-05 (×6): qty 1

## 2011-09-05 NOTE — Anesthesia Postprocedure Evaluation (Signed)
  Anesthesia Post-op Note  Patient: Anna Tran  Procedure(s) Performed: * No procedures listed *  Patient Location: Mother/Baby  Anesthesia Type: Epidural  Level of Consciousness: awake, alert  and oriented  Airway and Oxygen Therapy: Patient Spontanous Breathing  Post-op Pain: mild  Post-op Assessment: Patient's Cardiovascular Status Stable, Respiratory Function Stable, Patent Airway, No signs of Nausea or vomiting and Pain level controlled  Post-op Vital Signs: stable  Complications: No apparent anesthesia complications

## 2011-09-05 NOTE — Progress Notes (Signed)
PPD#0 s/p VBAC  Pt notes minimal pain, minimal lochia, Tol reg po, no N/V. + ambulation, + flatus, + void. Breastfeeding well. Would like d/c as soon as possible. Happy w/ VBAC  Gen: well appearing, no distress CV; RRR Pulm: CTAB Abd: NT, ND, fundus firm, NT, below umbilicus GU: minimal staining LE: NT, no edema  Filed Vitals:   09/05/11 0226 09/05/11 0242 09/05/11 0355 09/05/11 0750  BP: 117/68 113/75 104/58 100/64  Pulse: 79 80 81 64  Temp: 98.4 F (36.9 C) 97.7 F (36.5 C) 98.2 F (36.8 C) 98 F (36.7 C)  TempSrc: Oral Oral Oral Oral  Resp: 18 18 16 18   Height:      Weight:      SpO2:    98%     CBC    Component Value Date/Time   WBC 16.0* 09/05/2011 0522   RBC 4.04 09/05/2011 0522   HGB 11.8* 09/05/2011 0522   HCT 35.0* 09/05/2011 0522   PLT 182 09/05/2011 0522   MCV 86.6 09/05/2011 0522   MCH 29.2 09/05/2011 0522   MCHC 33.7 09/05/2011 0522   RDW 13.1 09/05/2011 0522   A/P: PPD# 0 s/p VBAC - recovering appropriately - requests early d/c, OK to d/c mom now. Would like to home at [facility] after PKU drawn  Britain Saber A. 09/05/2011 11:47 AM

## 2011-09-05 NOTE — Progress Notes (Signed)
Pt to room 114 via w/c in stable condition. Report given to RN.

## 2011-09-05 NOTE — Discharge Summary (Signed)
Obstetric Discharge Summary Reason for Admission: onset of labor Prenatal Procedures: none Intrapartum Procedures: spontaneous vaginal delivery and VBAC Postpartum Procedures: none Complications-Operative and Postpartum: none Hemoglobin  Date Value Range Status  09/05/2011 11.8* 12.0-15.0 (g/dL) Final     HCT  Date Value Range Status  09/05/2011 35.0* 36.0-46.0 (%) Final    Discharge Diagnoses: Term Pregnancy-delivered and VBAC  Discharge Information: Date: 09/05/2011 Activity: pelvic rest Diet: routine Medications: PNV, Ibuprofen and Colace Condition: stable Instructions: refer to practice specific booklet Discharge to: home   Newborn Data: Live born female  Birth Weight: 6 lb 11.9 oz (3060 g) APGAR: 8, 9  Home with mother.  Dela Sweeny A. 09/05/2011, 11:47 AM

## 2011-09-06 MED ORDER — BENZOCAINE-MENTHOL 20-0.5 % EX AERO
INHALATION_SPRAY | CUTANEOUS | Status: AC
  Filled 2011-09-06: qty 56

## 2011-09-06 MED ORDER — OXYCODONE-ACETAMINOPHEN 5-325 MG PO TABS: 1.0000 | ORAL_TABLET | ORAL | Status: AC | PRN

## 2011-09-06 MED ORDER — IBUPROFEN 600 MG PO TABS: 600.0000 mg | ORAL_TABLET | Freq: Four times a day (QID) | ORAL | Status: AC

## 2011-09-06 NOTE — Progress Notes (Signed)
Patient ID: Anna Tran, female   DOB: September 04, 1975, 36 y.o.   MRN: [id number]  PPD 1 SVD / VBAC  S:  Reports feeling well - desires early discharge today             Tolerating po/ No nausea or vomiting             Bleeding is light             Pain controlled with motrin and percocet             Up ad lib / ambulatory  Newborn breast feeding  / female newborn   O:  A & O x 3 NAD             VS: Blood pressure 121/76, pulse 62, temperature 98.4 F (36.9 C), temperature source Oral, resp. rate 20, height 5\' 5"  (1.651 m), weight 76.658 kg (169 lb), SpO2 98.00%, unknown if currently breastfeeding.  Lungs: Clear and unlabored  Heart: regular rate and rhythm / no mumurs  Abdomen: soft, non-tender, non-distended              Fundus: firm, non-tender, U-1  Perineum: mild edema  Lochia: light  Extremities: no edema, no calf pain or tenderness    A: PPD # 1 SVD / VBAC  Doing well - stable status  P:  Routine post partum orders  Discharge to home today  [facility] 09/06/2011, 7:07 AM

## 2011-09-06 NOTE — Discharge Summary (Signed)
Obstetric Discharge Summary Reason for Admission: onset of labor / previous c-section desires TOLAC Prenatal Procedures: none Intrapartum Procedures: spontaneous vaginal delivery and GBS prophylaxis and epidural Postpartum Procedures: none Complications-Operative and Postpartum: 2nd  degree perineal laceration Hemoglobin  Date Value Range Status  09/05/2011 11.8* 12.0-15.0 (g/dL) Final     HCT  Date Value Range Status  09/05/2011 35.0* 36.0-46.0 (%) Final    Discharge Diagnoses: Term Pregnancy-delivered and Successful VBAC  Discharge Information: Date: 09/06/2011 Activity: pelvic rest Diet: routine Medications: PNV, Ibuprofen and Percocet Condition: stable Instructions: refer to practice specific booklet Discharge to: home Follow-up Information    Follow up with [facility] A[facility] MD. Schedule an appointment as soon as possible for a visit in 6 weeks.   Contact information:   [address] [place] [address] [phone]          Newborn Data: Live born female  Birth Weight: 6 lb 11.9 oz (3060 g) APGAR: 8, 9  Home with mother.  Anna Tran 09/06/2011, 8:00 AM

## 2011-09-10 ENCOUNTER — Ambulatory Visit (HOSPITAL_COMMUNITY)
Admission: RE | Admit: 2011-09-10 | Discharge: 2011-09-10 | Disposition: A | Discharge status: Home / Self Care | Payer: BC Managed Care – PPO | Source: Ambulatory Visit | Attending: Obstetrics | Admitting: Obstetrics

## 2011-09-10 NOTE — Progress Notes (Signed)
  Adult Lactation Consultation Outpatient Visit Note  Patient Name: Anna Tran Date of Birth: 1976-03-02 Gestational Age at Delivery: [redacted]w[redacted]d Type of Delivery: vaginal del  Breastfeeding History: Frequency of Breastfeeding: every  2-3 hrs  Length of Feeding: 15-20 mins Voids: 10 Stools: 6, mustard yellow watery  Supplementing / Method: Pumping:  Type of Pump Medela               Frequency:pumped x 2 since birth   Volume:    Comments:  Lactation visit to evaluate weight and complaints of sore nipple.  Consultation Evaluation: Observed nipple tissue,mothers left nipple has deep crack and right nipple slightly pink with tissue intact. Infant breastfeed on left breast in football hold for 15-16 mins. Became very sleepy. Josie transferred 40 ml. Attempt to offer right breast in x cradle. Infant too sleepy. Infant is jaundice. Encouraged mother to offer second breast each feeding to stimulate appetite and to increase milk supply. Mother breast are firm and observed hand expressed milk. Mother given lots of teaching on better position technique . encouraged to rotate positions and secure deep latch.    Initial Feeding Assessment: Pre-feed Weight:2850 Post-feed [id number] Amount Transferred:81ml Comments:  Additional Feeding Assessment: Pre-feed Weight: Post-feed Weight: Amount Transferred: Comments:  Additional Feeding Assessment: Pre-feed Weight: Post-feed Weight: Amount Transferred: Comments:  Total Breast milk Transferred this Visit: 40 ml Total Supplement Given:   Additional Interventions: Mother inst to continue to cue base feed infant for 15-20 mins on first breast and offer 2nd. inst to rotate positions frequently,instructed to wait for infant to open very wide before bringing quickly to breast. Mother inst to call OB and request Dr Butch Penny nipple cream to heal nipple. Mother was given comfort gels . Smart start to fup on Tuesday for weight check. Mother given  information on mothers support circle.  Follow-Up  PRN    Stevan Born [facility] 09/10/2011, 3:41 PM

## 2011-12-16 ENCOUNTER — Ambulatory Visit (INDEPENDENT_AMBULATORY_CARE_PROVIDER_SITE_OTHER): Payer: BC Managed Care – PPO | Admitting: Family Medicine

## 2011-12-16 ENCOUNTER — Encounter: Payer: Self-pay | Admitting: Family Medicine

## 2011-12-16 VITALS — BP 121/86 | HR 88 | Ht 65.0 in | Wt 145.0 lb

## 2011-12-16 DIAGNOSIS — M545 Low back pain, unspecified: Secondary | ICD-10-CM

## 2011-12-16 NOTE — Patient Instructions (Signed)
You have musculoskeletal low back pain (I believe this is related to ligamentous laxity based on your exam). Take tylenol for baseline pain relief (1-2 extra strength tabs 3x/day) Aleve 2 tabs twice a day with food for pain and inflammation as needed for pain.   Stay as active as possible. Start formal physical therapy and transition to a home exercise program. Do home exercises and stretches as directed on days you don't go to physical therapy. Consider massage, chiropractor, and/or acupuncture. Physical therapy has been shown to be helpful while the others have mixed results. Strengthening of low back muscles, abdominal musculature are key for long term pain relief. If not improving, will consider further imaging (x-rays, MRI). Follow up with me in 4-6 weeks for reevaluation.

## 2011-12-17 ENCOUNTER — Encounter: Payer: Self-pay | Admitting: Family Medicine

## 2011-12-17 DIAGNOSIS — M545 Low back pain, unspecified: Secondary | ICD-10-CM | POA: Insufficient documentation

## 2011-12-17 NOTE — Progress Notes (Signed)
  Subjective:    Patient ID: Anna Tran, female    DOB: [birth year], 36 y.o.   MRN: [id number]  PCP: None listed  HPI 36 yo F here for back pain.  Patient denies known injury. Has a 37 month old and started noticing past 3 weeks having low back pain worse at nighttime and first thing in morning when bending forward to pick her child up. No prior issues with her back. Worse with prolonged upright sitting. Better during the day. No bowel/bladder dysfunction. No numbness or tingling. No radiation into legs. Occasionally takes ibuprofen.  Past Medical History  Diagnosis Date  . Hemorrhoids     Current Outpatient Prescriptions on File Prior to Visit  Medication Sig Dispense Refill  . Prenatal Vit-Fe Fumarate-FA (PRENATAL MULTIVITAMIN) TABS Take 1 tablet by mouth daily.        Past Surgical History  Procedure Date  . Cystectomy   . Wisdom tooth extraction   . Cesarean section     FTP    Allergies  Allergen Reactions  . Amoxicillin Rash  . Sulfa Antibiotics Rash    History   Social History  . Marital Status: Married    Spouse Name: N/A    Number of Children: N/A  . Years of Education: N/A   Occupational History  . Not on file.   Social History Main Topics  . Smoking status: Never Smoker   . Smokeless tobacco: Never Used  . Alcohol Use: No  . Drug Use: No  . Sexually Active: Yes    Birth Control/ Protection: None   Other Topics Concern  . Not on file   Social History Narrative  . No narrative on file    Family History  Problem Relation Age of Onset  . Anesthesia problems Neg Hx   . Diabetes Neg Hx   . Heart attack Neg Hx   . Hypertension Neg Hx   . Sudden death Neg Hx   . Hyperlipidemia Mother   . Hyperlipidemia Father     BP 121/86  Pulse 88  Ht 5\' 5"  (1.651 m)  Wt 145 lb (65.772 kg)  BMI 24.13 kg/m2  Review of Systems See HPI above.    Objective:   Physical Exam Gen: NAD  Back: No gross deformity, scoliosis. No paraspinal, SI,  buttock, trochanter TTP .  No midline or bony TTP. FROM with pain on full flexion.  No pain on extension. Strength LEs 5/5 all muscle groups.   2+ MSRs in patellar and achilles tendons, equal bilaterally. Negative SLRs. Sensation intact to light touch bilaterally. Negative logroll bilateral hips Negative fabers and piriformis stretches.    Assessment & Plan:  1. Low back pain - Exam benign except for pain on flexion.  Recent pregnancy, no muscular/SI joint tenderness, no injury suggest postpartum ligamentous laxity likely contributory to her pain.  Should do well with conservative therapy.  Tylenol as needed, start PT with transition to HEP.  Aleve prn.  Given her exam do not think massage or active release would be of benefit.  F/u in 4-6 weeks.  If not improving, consider further imaging (x-rays, MRI).

## 2011-12-17 NOTE — Assessment & Plan Note (Signed)
Exam benign except for pain on flexion.  Recent pregnancy, no muscular/SI joint tenderness, no injury suggest postpartum ligamentous laxity likely contributory to her pain.  Should do well with conservative therapy.  Tylenol as needed, start PT with transition to HEP.  Aleve prn.  Given her exam do not think massage or active release would be of benefit.  F/u in 4-6 weeks.  If not improving, consider further imaging (x-rays, MRI).

## 2011-12-28 ENCOUNTER — Ambulatory Visit: Payer: BC Managed Care – PPO | Attending: Family Medicine | Admitting: Rehabilitative and Restorative Service Providers"

## 2011-12-28 DIAGNOSIS — IMO0001 Reserved for inherently not codable concepts without codable children: Secondary | ICD-10-CM | POA: Insufficient documentation

## 2011-12-28 DIAGNOSIS — R293 Abnormal posture: Secondary | ICD-10-CM | POA: Insufficient documentation

## 2011-12-28 DIAGNOSIS — M545 Low back pain, unspecified: Secondary | ICD-10-CM | POA: Insufficient documentation

## 2011-12-29 ENCOUNTER — Ambulatory Visit: Payer: BC Managed Care – PPO | Admitting: Physical Therapy

## 2012-01-03 ENCOUNTER — Ambulatory Visit: Payer: BC Managed Care – PPO | Admitting: Rehabilitative and Restorative Service Providers"

## 2012-01-11 ENCOUNTER — Ambulatory Visit: Payer: BC Managed Care – PPO | Admitting: Rehabilitative and Restorative Service Providers"

## 2012-01-17 ENCOUNTER — Ambulatory Visit: Payer: BC Managed Care – PPO | Admitting: Physical Therapy

## 2012-01-19 ENCOUNTER — Encounter: Payer: BC Managed Care – PPO | Admitting: Physical Therapy

## 2012-01-21 ENCOUNTER — Ambulatory Visit: Payer: BC Managed Care – PPO | Admitting: Physical Therapy

## 2012-11-29 ENCOUNTER — Encounter: Payer: Self-pay | Admitting: Family Medicine

## 2012-11-29 ENCOUNTER — Ambulatory Visit (INDEPENDENT_AMBULATORY_CARE_PROVIDER_SITE_OTHER): Payer: No Typology Code available for payment source | Admitting: Family Medicine

## 2012-11-29 VITALS — BP 102/70 | HR 68 | Ht 64.0 in | Wt 137.0 lb

## 2012-11-29 DIAGNOSIS — K648 Other hemorrhoids: Secondary | ICD-10-CM

## 2012-11-29 DIAGNOSIS — K644 Residual hemorrhoidal skin tags: Secondary | ICD-10-CM

## 2012-11-29 DIAGNOSIS — E785 Hyperlipidemia, unspecified: Secondary | ICD-10-CM

## 2012-11-29 DIAGNOSIS — L290 Pruritus ani: Secondary | ICD-10-CM

## 2012-11-29 MED ORDER — HYDROCORTISONE 2.5 % RE CREA: TOPICAL_CREAM | Freq: Two times a day (BID) | RECTAL | Status: DC

## 2012-11-29 MED ORDER — HYDROCORTISONE ACETATE 25 MG RE SUPP: 25.0000 mg | Freq: Two times a day (BID) | RECTAL | Status: DC

## 2012-11-29 NOTE — Patient Instructions (Addendum)

## 2012-11-29 NOTE — Progress Notes (Signed)
Chief Complaint  Patient presents with  . Establish Care    new patient to re-establish(did see Dr.Charlise Giovanetti at [facility] 4-5 years ago). Has been having issues with hemorrhoids x 15 months-after birth of last child. Family history of high cholesterol.   Complaining of both external and internal hemorrhoids since the birth of her last child.  She has tried external and internal creams for hemorrhoids--seemed to get worse after using it for a week.  External hemorrhoid periodically bleeds.  Denies constipation, but feels like the internal hemorrhoids block the flow of stool, contributing to her feeling bloated and backed up.  Seems harder to pass when stools are softer. Passes stools daily. Hemorrhoids are worse when on her menstrual cycle.  Past Medical History  Diagnosis Date  . Hemorrhoids    Past Surgical History  Procedure Laterality Date  . Cystectomy      cyst removed from groin  . Wisdom tooth extraction    . Cesarean section      FTP   History   Social History  . Marital Status: Married    Spouse Name: N/A    Number of Children: N/A  . Years of Education: N/A   Occupational History  . owns [employer]    Social History Main Topics  . Smoking status: Never Smoker   . Smokeless tobacco: Never Used  . Alcohol Use: Yes     Comment: 2 glasses of wine every couple of months.   . Drug Use: No  . Sexually Active: Yes -- Female partner(s)    Birth Control/ Protection: None     Comment: husband with vasectomy   Other Topics Concern  . Not on file   Social History Narrative   Married, lives with husband, 2 children (girl/boy), no pets.   Danielle Colella's sister-in-law   Family History  Problem Relation Age of Onset  . Anesthesia problems Neg Hx   . Diabetes Neg Hx   . Heart attack Neg Hx   . Sudden death Neg Hx   . Colon cancer Neg Hx   . Breast cancer Neg Hx   . Hyperlipidemia Mother   . Depression Mother   . Hyperlipidemia Father   . Asthma Father   .  Hypertension Father   . Cancer Paternal Grandmother     skin cancer   No current outpatient prescriptions on file prior to visit.   No current facility-administered medications on file prior to visit.   Allergies  Allergen Reactions  . Amoxicillin Rash  . Sulfa Antibiotics Rash   ROS: Denies fevers, headaches, dizziness, fatigue, nausea, vomiting.  denies bruising, rashes other than the itching and rash in gluteal fold/perirectal area.  Denies allergies, URI symptoms, cough, shortness of breath. Denies depression or any other concerns.   PHYSICAL EXAM: BP 102/70  Pulse 68  Ht 5\' 4"  (1.626 m)  Wt 137 lb (62.143 kg)  BMI 23.5 kg/m2  LMP 11/26/2012  Breastfeeding? No Well developed, pleasant female in no distress HEENT:  Conjunctiva clear.  OP clear Neck: no lymphadenopathy, thyromegaly or mass Heart: regular rate and rhythm without murmur Lungs: clear bilaterally Abdomen: soft, nontender, no mass External rectal area--hypopigmentation and thickened skin externally, along gluteal fold extending to perineum.  No plaque. No fissures, but skin seems somewhat thickened.  No erythema or satellite pustules. Small noninflamed external hemorrhoidal tag posteriorly. Internal hemorrhoids are palpable, nontender.  Stool is light brown, heme negative Extremities: no edema Neuro: alert and oriented. Normal strength, sensation, gait  Psych:  Normal mood, affect, hygiene and grooming  ASSESSMENT/PLAN:  Internal hemorrhoid - Plan: hydrocortisone (ANUSOL-HC) 25 MG suppository  Anal itching  External hemorrhoid - Plan: hydrocortisone (ANUSOL-HC) 2.5 % rectal cream  Dyslipidemia - Plan: Lipid panel  anusol HC suppository and cream.  Start with suppository first since internal hemorrhoids are more symptomatic.  If doesn't exacerbate symptoms, then use the external cream--to the hemorrhoids, but also may benefit from using on the thickened, itchy portion of skin.  If it makes the itching or rash  worse, then stop the external use.  Consider other topical treatments including medicated powders, desitin/barrier treatments.  If ongoing/worsening symptoms, may need further eval.  If internal hemorrhoids are symptomatic, can refer to surgeons vs GI If ongoing/worsening of itching/rash, may need dermatologist or recheck here  H/o high cholesterol, previously took fish oil.  Not on anything now, wants to have checked.  Return fasting (mentioned at end of visit)--labs ordered.

## 2012-12-01 ENCOUNTER — Other Ambulatory Visit: Payer: No Typology Code available for payment source

## 2012-12-01 DIAGNOSIS — E785 Hyperlipidemia, unspecified: Secondary | ICD-10-CM

## 2012-12-01 LAB — LIPID PANEL
HDL: 54 mg/dL (ref >39)
LDL Cholesterol: 108 mg/dL — ABNORMAL HIGH (ref 0–99)
Total CHOL/HDL Ratio: 3.1 Ratio
Triglycerides: 37 mg/dL (ref <150)
VLDL: 7 mg/dL (ref 0–40)

## 2012-12-02 ENCOUNTER — Encounter: Payer: Self-pay | Admitting: Family Medicine

## 2013-01-08 ENCOUNTER — Encounter: Payer: Self-pay | Admitting: *Deleted

## 2013-03-14 ENCOUNTER — Ambulatory Visit (INDEPENDENT_AMBULATORY_CARE_PROVIDER_SITE_OTHER): Payer: No Typology Code available for payment source | Admitting: Family Medicine

## 2013-03-14 ENCOUNTER — Encounter: Payer: Self-pay | Admitting: Family Medicine

## 2013-03-14 VITALS — BP 124/86 | HR 68 | Temp 98.1°F | Ht 64.0 in | Wt 141.0 lb

## 2013-03-14 DIAGNOSIS — R05 Cough: Secondary | ICD-10-CM

## 2013-03-14 DIAGNOSIS — B369 Superficial mycosis, unspecified: Secondary | ICD-10-CM

## 2013-03-14 DIAGNOSIS — J069 Acute upper respiratory infection, unspecified: Secondary | ICD-10-CM

## 2013-03-14 DIAGNOSIS — R059 Cough, unspecified: Secondary | ICD-10-CM

## 2013-03-14 DIAGNOSIS — N926 Irregular menstruation, unspecified: Secondary | ICD-10-CM

## 2013-03-14 NOTE — Progress Notes (Signed)
Chief Complaint  Patient presents with  . Cough    -started with left ear pain and sore throat 2 and half weeks ago. Now she is coughing a lot-up all night. May be unrelated but on Aug 8th started her period and is still bleeding-the first 10 days were clotty. Just had a period July 20th.    2.5 weeks ago she started with sore throat, and L-sided throat and ear pain.  She had some tactile fevers during that time, along with cough and sneezing, some headaches and body aches.  Her kids were both sick with viral illnesses.  Throat started feeling a little better, but then developed pain over her L eye. This is a little better now.  Only mild sore throat now.  Last night she was up all night coughing.  Dry, hacky,  Nonproductive.  Denies postnasal drip, but feels dry in her throat, tickle in throat.  She has taken ibuprofen, and took some robitussin early on in illness. Not taking any OTC meds recently.  LMP 7/20, then bleeding started again 8/8, very light, didn't need tampon, but noticed some blood (clot in toilet, some spotting) daily since 8/8.  Yesterday it started becoming heavier, like a regular period.  Her husband had a vasectomy.  Youngest child is 18 months.  Periods are heavier since last child  Ongoing rash in gluteal fold.  The anusol [facility] used externally didn't help, thought it made it worse.  She also tried Tucks pads.  Past Medical History  Diagnosis Date  . Hemorrhoids   . Hyperlipidemia   . Chicken pox   . Acid reflux   . Tonsillitis    Past Surgical History  Procedure Laterality Date  . Cystectomy      cyst removed from groin  . Wisdom tooth extraction    . Cesarean section      FTP   History   Social History  . Marital Status: Married    Spouse Name: N/A    Number of Children: N/A  . Years of Education: N/A   Occupational History  . owns [employer]    Social History Main Topics  . Smoking status: Never Smoker   . Smokeless tobacco: Never Used   . Alcohol Use: Yes     Comment: 2 glasses of wine every couple of months.   . Drug Use: No  . Sexual Activity: Yes    Partners: Male    Birth Control/ Protection: None     Comment: husband with vasectomy   Other Topics Concern  . Not on file   Social History Narrative   Married, lives with husband, 2 children (girl/boy), no pets.   Danielle Bensman's sister-in-law   Current outpatient prescriptions:hydrocortisone (ANUSOL-HC) 2.5 % rectal cream, Place rectally 2 (two) times daily., Disp: 30 g, Rfl: 1;  hydrocortisone (ANUSOL-HC) 25 MG suppository, Place 1 suppository (25 mg total) rectally 2 (two) times daily., Disp: 12 suppository, Rfl: 1  Allergies  Allergen Reactions  . Amoxicillin Rash  . Sulfa Antibiotics Rash    ROS:  Denies fevers, chills, dizziness, chest pain, shortness of breath, nausea, vomiting, diarrhea, bleeding/bruising, myalgias/arthralgias.  Some intermittent GI problems.  Abnormal menstrual bleeding as per HPI. See HPI.  PHYSICAL EXAM: BP 124/86  Pulse 68  Temp(Src) 98.1 F (36.7 C) (Oral)  Ht 5\' 4"  (1.626 m)  Wt 141 lb (63.957 kg)  BMI 24.19 kg/m2  LMP 03/02/2013  Breastfeeding? No Well developed, pleasant female with slightly hoarse voice and  some throat-clearing.  She is in no distress. HEENT:  PERRL, EOMI, conjunctiva clear.  TMs and EAC's normal.  Nasal mucosa mildly edematous, no erythema.  Clear-white mucus present, thicker posteriorly.  Sinuses nontender.  OP:  Erythema/cobblestoning posteriorly.  Cryptic, mildly enlarged tonsils L>R.  Small concretion on left.  No erythema of tonsils, no exudates Neck: no lymphadenopathy or mass Heart: regular rate and rhythm without murmur Lungs: clear bilaterally without wheezes, rales, ronchi Skin: no rash Perineal area--very mild erythema with dry flaky edges.    ASSESSMENT/PLAN:  Acute upper respiratory infections of unspecified site - no evidence of bacterial infection at this time  Cough  Irregular  menses - f/u with GYN if persists--she is past due for routine visit/pap and plans to schedule.  keep calendar of bleeding  Fungal dermatitis - in perineum, gluteal fold.  Trial of topical antifungals  We discussed guaifenesin (Mucinex) as expectorant to thin out mucus and help with chest congestion.  Dextromethorphan (ie Delsym syrup or in combinations) is a cough suppressant.  Decongestants such as sudafed will help dry up the drainage down the throat and help with sinus pressure.  Local honey might help with cough. Call for prescription of benzonatate if dry hacky cough persists.  Perineal rash. Given appearance, and that steroid made it worse, it is likely fungal.  Trial of clotrimazole cream or lamisil cream twice daily for 2-3 weeks.  F/u with GYN if ongoing problems.  She is past due for pap, so needs to schedule OV anyway. Briefly discussed abnormal periods, with intermenstrual spotting. Hopefully things will normalize after this regular period.  Keep calendar

## 2013-03-14 NOTE — Patient Instructions (Addendum)
We discussed guaifenesin (Mucinex) as expectorant to thin out mucus and help with chest congestion.  Dextromethorphan (ie Delsym syrup or in combinations) is a cough suppressant.  Decongestants such as sudafed will help dry up the drainage down the throat and help with sinus pressure. If you develop more sinus pressure, consider sinus rinses or neti-pot.    Local honey might help with cough. Call for prescription of benzonatate if dry hacky cough persists.   See your GYN if ongoing problems with irregular periods.  Hopefully things will normalize after this regular period.  Keep calendar of your bleeding.  Perineal rash. Given appearance, and that steroid made it worse, it is likely fungal.  Trial of clotrimazole cream or lamisil cream twice daily for 2-3 weeks.

## 2013-03-20 ENCOUNTER — Telehealth: Payer: Self-pay | Admitting: Family Medicine

## 2013-03-20 DIAGNOSIS — R059 Cough, unspecified: Secondary | ICD-10-CM

## 2013-03-20 DIAGNOSIS — R05 Cough: Secondary | ICD-10-CM

## 2013-03-20 MED ORDER — BENZONATATE 200 MG PO CAPS: 200.0000 mg | ORAL_CAPSULE | Freq: Three times a day (TID) | ORAL | Status: DC | PRN

## 2013-03-20 NOTE — Telephone Encounter (Signed)
We had talked about calling for tessalon rx if cough persisted, not responding to the guaifenesin and dextromethorphan.  I sent rx to her pharmacy.  Please see if she is getting worse--ie fevers, purulent drainage, shortness of breath, worsening cough--where perhaps we should consider a Z-pak along with the cough med.  If her symptoms overall are improving, just persistent dry cough (she had been having a lot of head congestion), then just use the cough med.  If increasing/worsening sinus symptoms, purulent mucus, then please also send in rx for z-pak.  Thanks

## 2013-03-20 NOTE — Telephone Encounter (Signed)
Pt called back and asked for chandra but did not stay on phone. When i called pt back she did not answer

## 2013-03-20 NOTE — Telephone Encounter (Signed)
lmom to cb. cls 

## 2013-03-21 ENCOUNTER — Telehealth: Payer: Self-pay | Admitting: *Deleted

## 2013-03-21 NOTE — Telephone Encounter (Signed)
Spoke with patient and she really doesn't feel that she is worsening. I let her know that her rx was called into pharmacy. She will start with this and if anything changes she will call back.

## 2013-03-27 ENCOUNTER — Telehealth: Payer: Self-pay | Admitting: Internal Medicine

## 2013-03-27 MED ORDER — AZITHROMYCIN 250 MG PO TABS: ORAL_TABLET | ORAL | Status: DC

## 2013-03-27 NOTE — Telephone Encounter (Signed)
Pt is not feeling any better and would like a z-pak sent to [facility] [address]

## 2013-03-27 NOTE — Telephone Encounter (Signed)
Called and advised pt.

## 2013-03-27 NOTE — Telephone Encounter (Signed)
Advise pt rx sent in to pharmacy

## 2013-07-24 ENCOUNTER — Ambulatory Visit (INDEPENDENT_AMBULATORY_CARE_PROVIDER_SITE_OTHER): Payer: No Typology Code available for payment source | Admitting: Medical

## 2013-07-24 ENCOUNTER — Encounter: Payer: Self-pay | Admitting: Medical

## 2013-07-24 VITALS — BP 102/68 | HR 68 | Temp 98.2°F | Resp 16 | Wt 143.0 lb

## 2013-07-24 DIAGNOSIS — R05 Cough: Secondary | ICD-10-CM

## 2013-07-24 DIAGNOSIS — R059 Cough, unspecified: Secondary | ICD-10-CM

## 2013-07-24 DIAGNOSIS — J111 Influenza due to unidentified influenza virus with other respiratory manifestations: Secondary | ICD-10-CM

## 2013-07-24 DIAGNOSIS — R509 Fever, unspecified: Secondary | ICD-10-CM

## 2013-07-24 DIAGNOSIS — J101 Influenza due to other identified influenza virus with other respiratory manifestations: Secondary | ICD-10-CM

## 2013-07-24 LAB — POCT INFLUENZA A/B: Influenza A, POC: POSITIVE

## 2013-07-24 NOTE — Progress Notes (Signed)
Subjective:  Anna Tran is a 37 y.o. female who presents for illness.  Has had a few weeks of cough and congestion, but in the past 4 days been having worse head congestion, cough, sneezing, sinus pressure, some low grade fever, chills/flushing alternating, some sore throat, ear itching.   Denies NVD, no rash, no teeth pain.  Patient is a non-smoker.  Using ibuprofen, mucinex, dayquil for symptoms.  + sick contacts -her whole household has been sick with the same symptoms for the last 4 days or so, both of her children saw their pediatrician today and were diagnosed with ear infections.  No other aggravating or relieving factors.  No other c/o.  The following portions of the patient's history were reviewed and updated as appropriate: allergies, current medications, past medical history, past social history and problem list.  ROS as in subjective   Past Medical History  Diagnosis Date  . Hemorrhoids   . Hyperlipidemia   . Chicken pox   . Acid reflux   . Tonsillitis      Objective: BP 102/68  Pulse 68  Temp(Src) 98.2 F (36.8 C) (Oral)  Resp 16  Wt 143 lb (64.864 kg)   General: Ill-appearing, well-developed, well-nourished Skin: warm, dry HEENT: Nose inflamed and congested, clear conjunctiva, TMs pearly, mild frontal sinus tenderness, pharynx with mild erythema, no exudates Neck: Supple, nontender, shoddy cervical adenopathy Heart: Regular rate and rhythm, normal S1, S2, no murmurs Lungs: Clear to auscultation bilaterally, no wheezes, rales, rhonchi Extremities: Mild generalized tenderness   Assessment and Plan: Encounter Diagnoses  Name Primary?  . Fever, unspecified Yes  . Cough   . Influenza A    +flu A.   Discussed diagnosis of influenza. Discussed supportive care including rest, hydration, OTC Tylenol or NSAID for fever, aches, and malaise.  Discussed period of contagion, self quarantine at home away from others to avoid spread of disease, discussed means of  transmission, and possible complications including pneumonia.  If worse or not improving within the next 3-4 days, then call or return.  Discussed her children's symptoms and advise she let their pediatrician know that she was + for flu today, in light of their symptoms.

## 2013-08-02 ENCOUNTER — Ambulatory Visit (INDEPENDENT_AMBULATORY_CARE_PROVIDER_SITE_OTHER): Payer: BC Managed Care – PPO | Admitting: Family Medicine

## 2013-08-02 ENCOUNTER — Encounter: Payer: Self-pay | Admitting: Family Medicine

## 2013-08-02 VITALS — BP 130/78 | HR 80 | Ht 64.0 in | Wt 144.0 lb

## 2013-08-02 DIAGNOSIS — M5416 Radiculopathy, lumbar region: Secondary | ICD-10-CM

## 2013-08-02 DIAGNOSIS — IMO0002 Reserved for concepts with insufficient information to code with codable children: Secondary | ICD-10-CM

## 2013-08-02 DIAGNOSIS — Z23 Encounter for immunization: Secondary | ICD-10-CM

## 2013-08-02 MED ORDER — TRAMADOL HCL 50 MG PO TABS: 50.0000 mg | ORAL_TABLET | Freq: Four times a day (QID) | ORAL | Status: DC | PRN

## 2013-08-02 MED ORDER — METHYLPREDNISOLONE (PAK) 4 MG PO TABS: ORAL_TABLET | ORAL | Status: DC

## 2013-08-02 NOTE — Patient Instructions (Signed)
Medrol dosepak--take as directed.   Avoid other anti-inflammatories while on steroids.  Once course is completed, it is okay to resume ibuprofen, up to 843m three times daily with food. Otherwise, during the steroid pack, use tylenol as needed for pain during the day, and use tramadol as needed for severe pain.  If symptoms do not improve, I recommend imaging (x-Grundman, MRI). Consider PT or chiro  Return immediately if fevers, weakness, bowel or bladder leakage/dysfunction  Sciatica Sciatica is pain, weakness, numbness, or tingling along the path of the sciatic nerve. The nerve starts in the lower back and runs down the back of each leg. The nerve controls the muscles in the lower leg and in the back of the knee, while also providing sensation to the back of the thigh, lower leg, and the sole of your foot. Sciatica is a symptom of another medical condition. For instance, nerve damage or certain conditions, such as a herniated disk or bone spur on the spine, pinch or put pressure on the sciatic nerve. This causes the pain, weakness, or other sensations normally associated with sciatica. Generally, sciatica only affects one side of the body. CAUSES   Herniated or slipped disc.  Degenerative disk disease.  A pain disorder involving the narrow muscle in the buttocks (piriformis syndrome).  Pelvic injury or fracture.  Pregnancy.  Tumor (rare). SYMPTOMS  Symptoms can vary from mild to very severe. The symptoms usually travel from the low back to the buttocks and down the back of the leg. Symptoms can include:  Mild tingling or dull aches in the lower back, leg, or hip.  Numbness in the back of the calf or sole of the foot.  Burning sensations in the lower back, leg, or hip.  Sharp pains in the lower back, leg, or hip.  Leg weakness.  Severe back pain inhibiting movement. These symptoms may get worse with coughing, sneezing, laughing, or prolonged sitting or standing. Also, being  overweight may worsen symptoms. DIAGNOSIS  Your caregiver will perform a physical exam to look for common symptoms of sciatica. He or she may ask you to do certain movements or activities that would trigger sciatic nerve pain. Other tests may be performed to find the cause of the sciatica. These may include:  Blood tests.  X-rays.  Imaging tests, such as an MRI or CT scan. TREATMENT  Treatment is directed at the cause of the sciatic pain. Sometimes, treatment is not necessary and the pain and discomfort goes away on its own. If treatment is needed, your caregiver may suggest:  Over-the-counter medicines to relieve pain.  Prescription medicines, such as anti-inflammatory medicine, muscle relaxants, or narcotics.  Applying heat or ice to the painful area.  Steroid injections to lessen pain, irritation, and inflammation around the nerve.  Reducing activity during periods of pain.  Exercising and stretching to strengthen your abdomen and improve flexibility of your spine. Your caregiver may suggest losing weight if the extra weight makes the back pain worse.  Physical therapy.  Surgery to eliminate what is pressing or pinching the nerve, such as a bone spur or part of a herniated disk. HOME CARE INSTRUCTIONS   Only take over-the-counter or prescription medicines for pain or discomfort as directed by your caregiver.  Apply ice to the affected area for 20 minutes, 3 4 times a day for the first 48 72 hours. Then try heat in the same way.  Exercise, stretch, or perform your usual activities if these do not aggravate your pain.  Attend physical therapy sessions as directed by your caregiver.  Keep all follow-up appointments as directed by your caregiver.  Do not wear high heels or shoes that do not provide proper support.  Check your mattress to see if it is too soft. A firm mattress may lessen your pain and discomfort. SEEK IMMEDIATE MEDICAL CARE IF:   You lose control of your  bowel or bladder (incontinence).  You have increasing weakness in the lower back, pelvis, buttocks, or legs.  You have redness or swelling of your back.  You have a burning sensation when you urinate.  You have pain that gets worse when you lie down or awakens you at night.  Your pain is worse than you have experienced in the past.  Your pain is lasting longer than 4 weeks.  You are suddenly losing weight without reason. MAKE SURE YOU:  Understand these instructions.  Will watch your condition.  Will get help right away if you are not doing well or get worse. Document Released: 07/06/2001 Document Revised: 01/11/2012 Document Reviewed: 11/21/2011 [facility] Patient Information 2014 [place].

## 2013-08-02 NOTE — Progress Notes (Signed)
Chief Complaint  Patient presents with  . Leg Pain    left sided leg pain, started in her lower back on the left mid December but has moved down her left leg down to her calf-worst pain is the back of her left thigh. Is alternating IBU and tylenol. Saw Audelia Acton 07/24/13 diagnosed with flu, should she still get flu shot, she would like to know.    She is complaining of pain in her left posterior thigh since mid-December.  It started in her back, and radiated down into the hip and buttock, to the leg.  Back pain has resolved, but now only having pain in the leg.  The severe pain is in the posterior thigh, sometimes radiates down into the calf.  Pain was worse when she was sick with the flu, and last night she was in excruciating pain--shooting pain, sometimes sharp and shooting, mostly a "severe aching:"  She tried heat and ice (sometimes heat seemed helpful, other times she thought it made it worse; ice helped today).  If she lies on her stomach, with her hips and knees flexed, and bottom up in the air (like a baby) this position seems to improve her pain.  She has been taking 611m ibuprofen every 6 hours (2 doses today) with tylenol dosed in between these.  It takes the edge off.  Pain comes and goes in waves, better if she is on her feet more (better when working).  Pain is worse at night when lying down.  Worse with coughing and sneezing.  She had recent flu--she is much better.  She has had back pain on and off since her last pregnancy.  Hasn't been having much back pain lately. Denies any numbness, tingling or weakness in the leg.  She had a slight rash in the left leg, like pimples, and has resolved.  She tends to get skin rashes like this frequently.  Never had skin sensitivity.    Past Medical History  Diagnosis Date  . Hemorrhoids   . Hyperlipidemia   . Chicken pox   . Acid reflux   . Tonsillitis    Past Surgical History  Procedure Laterality Date  . Cystectomy      cyst removed from  groin  . Wisdom tooth extraction    . Cesarean section      FTP   History   Social History  . Marital Status: Married    Spouse Name: N/A    Number of Children: N/A  . Years of Education: N/A   Occupational History  . owns [employer]    Social History Main Topics  . Smoking status: Never Smoker   . Smokeless tobacco: Never Used  . Alcohol Use: Yes     Comment: 2 glasses of wine every couple of months.   . Drug Use: No  . Sexual Activity: Yes    Partners: Male    Birth Control/ Protection: None     Comment: husband with vasectomy   Other Topics Concern  . Not on file   Social History Narrative   Married, lives with husband, 2 children (girl/boy), no pets.   Anna Tran's sister-in-law   Current outpatient prescriptions:acetaminophen (TYLENOL) 325 MG tablet, Take 650 mg by mouth every 6 (six) hours as needed., Disp: , Rfl: ;  ibuprofen (ADVIL,MOTRIN) 600 MG tablet, Take 600 mg by mouth every 6 (six) hours as needed., Disp: , Rfl:   Allergies  Allergen Reactions  . Amoxicillin Rash  . Sulfa  Antibiotics Rash   ROS:  Denies recent fevers (just with flu, resolved), URI symptoms have resolved. No headaches, dizziness, chest pain, shortness of breath, nausea, vomiting, diarrhea, abdominal pain.  Denies bleeding, bruising.  +frequent mild rashes (likely follicular).  Denies bladder/bowel dysfunction, numbness, tingling, weakness, urinary complaints.  PHYSICAL EXAM: BP 130/78  Pulse 80  Ht 5' 4"  (1.626 m)  Wt 144 lb (65.318 kg)  BMI 24.71 kg/m2  LMP 07/19/2013  Breastfeeding? No Pleasant female, who is in no acute distress at time of visit Heart: regular rate and rhythm Lungs: clear bilaterally Back: Spine is nontender, no CVA tenderness, SI joint or sciatic notch tenderness.  FROM of pyriformis.  She has pulling sensation and discomfort with SLR on left and with standing with dorsiflexed left foot. Normal strength, sensation, gait, reflexes Skin:  A few  hyperpigmented macules in posterior thigh (looking like healed areas where follicles had been inflamed).  Not in a cluster. Psych: normal mood, affect, hygiene and grooming  ASSESSMENT/PLAN:  Left lumbar radiculopathy - Plan: methylPREDNIsolone (MEDROL DOSPACK) 4 MG tablet, traMADol (ULTRAM) 50 MG tablet  Need for prophylactic vaccination and inoculation against influenza - Plan: Flu Vaccine QUAD 36+ mos IM  Risks and side effects of steroids reviewed.  Medrol dosepak--take as directed.   Avoid other anti-inflammatories while on steroids.  Once course is completed, it is okay to resume ibuprofen, up to 869m three times daily with food. Otherwise, during the steroid pack, use tylenol as needed for pain during the day, and use tramadol as needed for severe pain.  If symptoms do not improve, I recommend imaging (x-[place], MRI). Consider PT or chiro  She is asking about flu shot. Discussed risks/side effects of flu shot.  She recently had A, shot would still protect against B (and other strains of A, but mismatched this year), so flu shot given.  F/u 1-2 weeks prn persistent/worsening symptoms

## 2014-05-27 ENCOUNTER — Encounter: Payer: Self-pay | Admitting: Family Medicine

## 2015-01-24 ENCOUNTER — Encounter: Payer: Self-pay | Admitting: Family Medicine

## 2015-01-24 ENCOUNTER — Ambulatory Visit (INDEPENDENT_AMBULATORY_CARE_PROVIDER_SITE_OTHER): Payer: BC Managed Care – PPO | Admitting: Family Medicine

## 2015-01-24 VITALS — BP 130/90 | HR 80 | Temp 98.4°F | Ht 64.0 in | Wt 142.4 lb

## 2015-01-24 DIAGNOSIS — L03114 Cellulitis of left upper limb: Secondary | ICD-10-CM | POA: Diagnosis not present

## 2015-01-24 NOTE — Patient Instructions (Addendum)
  Continue the Doxycyline until course is completed. The new border of redness was marked today--within the next 12 hours it should start to recede some, definitely not spread further (might take another 24 hours to see significant improvement). If the redness continues to spread, the streak advances up the arm, then the antibiotic needs to be changed. If you start having fevers, increasing lymph nodes, pain, vomiting, etc, then you probably should be re-evaluated by MD--might need an antibiotic injection.  Keeping the arm elevated will help with swelling.   Continue warm compresses 3 times daily (for the next 24 hours, just to see if any drainage occurs).  You can stop the compresses if no drainage, and the redness is improving with the antibiotics. Apply antibacterial ointment to the open wound (ie bacitracin).  Keep it covered with a bandage for protection.  If it isn't draining, and you want to leave it open to air, that's fine.   You may call my cell [phone]) if no improvement in the next 24-48 hours to have the antibiotic changed to Keflex (but if worse--fever, etc, then will need to go to Urgent Care)  Cellulitis Cellulitis is an infection of the skin and the tissue beneath it. The infected area is usually red and tender. Cellulitis occurs most often in the arms and lower legs.  CAUSES  Cellulitis is caused by bacteria that enter the skin through cracks or cuts in the skin. The most common types of bacteria that cause cellulitis are staphylococci and streptococci. SIGNS AND SYMPTOMS   Redness and warmth.  Swelling.  Tenderness or pain.  Fever. DIAGNOSIS  Your health care provider can usually determine what is wrong based on a physical exam. Blood tests may also be done. TREATMENT  Treatment usually involves taking an antibiotic medicine. HOME CARE INSTRUCTIONS   Take your antibiotic medicine as directed by your health care provider. Finish the antibiotic even if you start  to feel better.  Keep the infected arm or leg elevated to reduce swelling.  Apply a warm cloth to the affected area up to 4 times per day to relieve pain.  Take medicines only as directed by your health care provider.  Keep all follow-up visits as directed by your health care provider. SEEK MEDICAL CARE IF:   You notice red streaks coming from the infected area.  Your red area gets larger or turns dark in color.  Your bone or joint underneath the infected area becomes painful after the skin has healed.  Your infection returns in the same area or another area.  You notice a swollen bump in the infected area.  You develop new symptoms.  You have a fever. SEEK IMMEDIATE MEDICAL CARE IF:   You feel very sleepy.  You develop vomiting or diarrhea.  You have a general ill feeling (malaise) with muscle aches and pains. MAKE SURE YOU:   Understand these instructions.  Will watch your condition.  Will get help right away if you are not doing well or get worse. Document Released: 04/21/2005 Document Revised: 11/26/2013 Document Reviewed: 09/27/2011 [facility] Patient Information 2015 [place], [place]. This information is not intended to replace advice given to you by your health care provider. Make sure you discuss any questions you have with your health care provider.

## 2015-01-24 NOTE — Progress Notes (Signed)
Chief Complaint  Patient presents with  . Wound Infection    has an infection on left forearm. Her sister in law is a doctor and saw her yesterday and did an I&D, did not do a culture Started on 129m doxy yesterday and just wantded to follow up here since she already had appt to see if Dr.Valery Amedee had a different opinion.    She had a "skin tag" on her forearm for months, and a week ago it looked like it was "coming to a head". She squeezed it--had a white material (like a string) come out.  She didn't get it all out, felt very hard. Starting feeling worse after that--occasionally would notice some pus coming out.  Increased in size and pain. She started noticing some redness 4 days ago, getting worse over the last 2 days.  2 days ago she noted it starting to spread up the arm, and felt sore lymph nodes in her left axilla.  Her sister-in-law who is an ER doctor (Art gallery manager saw her yesterday (see scanned typed note), started her on doxycyline after seeing a picture of the arm, and later in the afternoon she I&D'd it.  She didn't get any drainage after incising.  She kept it elevated the rest of the night, and soaking with warm soaks every hour.   She marked the redness yesterday, and the redness has spread some since then.  She took 2 pills twice yesterday ("front loaded") and is to be taking 1 pill twice daily for the rest of the course. She is tolerating the medication without side effects.  She denies any fever or chills.  PMH, PSH, SH reviewed.  Outpatient Encounter Prescriptions as of 01/24/2015  Medication Sig Note  . doxycycline (VIBRAMYCIN) 100 MG capsule  01/24/2015: Received from: External Pharmacy  . ibuprofen (ADVIL,MOTRIN) 600 MG tablet Take 600 mg by mouth every 6 (six) hours as needed.   . [DISCONTINUED] acetaminophen (TYLENOL) 325 MG tablet Take 650 mg by mouth every 6 (six) hours as needed.   . [DISCONTINUED] methylPREDNIsolone (MEDROL DOSPACK) 4 MG tablet follow package directions    . [DISCONTINUED] traMADol (ULTRAM) 50 MG tablet Take 1-2 tablets (50-100 mg total) by mouth every 6 (six) hours as needed for severe pain.    No facility-administered encounter medications on file as of 01/24/2015.    Allergies  Allergen Reactions  . Amoxicillin Rash  . Sulfa Antibiotics Rash   (? If true amox allergy--states she had a rash, but later diagnosed with mono)  ROS:  No fevers, chills.  Last dose of ibuprofen was 8am. No headache, dizziness, shortness of breath, nausea, vomiting, abdominal pain or other complaints except as noted in HPI. She is somewhat anxious/upset, as she is having a surprise 89thbirthday party for her husband, and needs to be cleaning; doesn't want the red arm (and ink marks) to embarrass her.  PHYSICAL EXAM: BP 130/90 mmHg  Pulse 80  Temp(Src) 98.4 F (36.9 C) (Tympanic)  Ht 5' 4"  (1.626 m)  Wt 142 lb 6.4 oz (64.592 kg)  BMI 24.43 kg/m2  LMP 01/19/2015 Well developed, pleasant, slightly anxious female in no distress  Left anterior forearm: 8 x 15.5 cm area of erythema (marked with pen), plus a small streak extending proximally (not included in measurement).  Centrally there is a wound measuring 0.6 cm with no active drainage.  2cm area around the incision is indurated. No fluctuance.  Mildly tender  Very small axillary lymph node noted--less tender today, per  pt.   ASSESSMENT/PLAN:  Cellulitis of arm, left  Cellulitis L arm, possibly early abscess. No drainage after incision by Dr. Jeanell Sparrow yesterday.  Redness has increased a little today, but somewhat less painful including the axillary lymphadenopathy. It is <24 hrs on ABX.  Continue the Doxycyline until course is completed. The new border of redness was marked today--within the next 12 hours it should start to recede some, definitely not spread further (might take another 24 hours to see significant improvement). If the redness continues to spread, the streak advances up the arm, then the  antibiotic needs to be changed. If you start having fevers, increasing lymph nodes, pain, vomiting, etc, then you probably should be re-evaluated by MD--might need an antibiotic injection.  Keeping the arm elevated will help with swelling.   Continue warm compresses 3 times daily (for the next 24 hours, just to see if any drainage occurs).  You can stop the compresses if no drainage, and the redness is improving with the antibiotics. Apply antibacterial ointment to the open wound (ie bacitracin).  Keep it covered with a bandage for protection.  If it isn't draining, and you want to leave it open to air, that's fine.   You may call my cell [phone]) if no improvement in the next 24-48 hours to have the antibiotic changed to Keflex (but if worse--fever, etc, then will need to go to Urgent Care)

## 2016-02-25 ENCOUNTER — Encounter (INDEPENDENT_AMBULATORY_CARE_PROVIDER_SITE_OTHER): Payer: Self-pay

## 2016-02-25 ENCOUNTER — Encounter: Payer: Self-pay | Admitting: Family Medicine

## 2016-02-25 ENCOUNTER — Ambulatory Visit (INDEPENDENT_AMBULATORY_CARE_PROVIDER_SITE_OTHER): Payer: BC Managed Care – PPO | Admitting: Family Medicine

## 2016-02-25 DIAGNOSIS — M5442 Lumbago with sciatica, left side: Secondary | ICD-10-CM

## 2016-02-25 MED ORDER — PREDNISONE 10 MG PO TABS: ORAL_TABLET | ORAL | 0 refills | Status: DC

## 2016-02-25 NOTE — Patient Instructions (Signed)
You have lumbar radiculopathy (a pinched nerve in your low back). Ok to take tylenol for baseline pain relief (1-2 extra strength tabs 3x/day) A prednisone dose pack is the best option for immediate relief and may be prescribed. Day after finishing prednisone start aleve 2 tabs twice a day with food for pain and inflammation. Consider muscle relaxant, pain medication. Stay as active as possible. Physical therapy has been shown to be helpful as well. Strengthening of low back muscles, abdominal musculature are key for long term pain relief. After a few days pick 3-5 exercises, do them daily. If not improving, will consider further imaging (MRI). Call me in a week to let me know how you're doing.

## 2016-03-02 ENCOUNTER — Telehealth: Payer: Self-pay | Admitting: Family Medicine

## 2016-03-02 NOTE — Assessment & Plan Note (Signed)
history consistent with lumbar radiculopathy.  No red flags or concerning exam findings.  Start with prednisone dose pack, home exercises and stretches.  If not improving over the next week would consider MRI.  Advised her to call us then with an update on her status.

## 2016-03-02 NOTE — Telephone Encounter (Signed)
Next step we discussed is going ahead with MRI of lumbar spine - can order x-rays first if insurance requires those before approval.  Thanks!

## 2016-03-02 NOTE — Progress Notes (Addendum)
PCP: KNAPP,EVE A, MD  Subjective:   HPI: Patient is a 40 y.o. female here for low back pain.  Patient reports she's had worsening pain left side of low back for 2 weeks. Radiates down left leg to the calf. Tried ibuprofen without much benefit. Felt like a bad charlie horse in leg and also recalls doing a new workout but no acute injury. Pain level is 5/10, sharp. No numbness or tingling. Having difficulty sleeping. Has been stretching. No bowel/bladder dysfunction.  Past Medical History:  Diagnosis Date  . Acid reflux   . Chicken pox   . Hemorrhoids   . Hyperlipidemia   . Tonsillitis     Current Outpatient Prescriptions on File Prior to Visit  Medication Sig Dispense Refill  . doxycycline (VIBRAMYCIN) 100 MG capsule     . ibuprofen (ADVIL,MOTRIN) 600 MG tablet Take 600 mg by mouth every 6 (six) hours as needed.     No current facility-administered medications on file prior to visit.     Past Surgical History:  Procedure Laterality Date  . CESAREAN SECTION     FTP  . CYSTECTOMY     cyst removed from groin  . WISDOM TOOTH EXTRACTION      Allergies  Allergen Reactions  . Amoxicillin Rash  . Sulfa Antibiotics Rash    Social History   Social History  . Marital status: Married    Spouse name: N/A  . Number of children: N/A  . Years of education: N/A   Occupational History  . 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 Smoking status: Never Smoker  . Smokeless tobacco: Never Used  . Alcohol use Yes     Comment: 2 glasses of wine every couple of months.   . Drug use: No  . Sexual activity: Yes    Partners: Male    Birth control/ protection: None     Comment: husband with vasectomy   Other Topics Concern  . Not on file   Social History Narrative   Married, lives with husband, 2 children (girl/boy), no pets.   Danielle Hunsberger's sister-in-law    Family History  Problem Relation Age of Onset  . Anesthesia problems Neg Hx    . Diabetes Neg Hx   . Heart attack Neg Hx   . Sudden death Neg Hx   . Colon cancer Neg Hx   . Breast cancer Neg Hx   . Hyperlipidemia Mother   . Depression Mother   . Hyperlipidemia Father   . Asthma Father   . Hypertension Father   . Cancer Paternal Grandmother     skin cancer    BP 135/85   Pulse 94   Ht 5' 5"  (1.651 m)   Wt 140 lb (63.5 kg)   BMI 23.30 kg/m   Review of Systems: See HPI above.    Objective:  Physical Exam:  Gen: NAD, comfortable in exam room  Back: No gross deformity, scoliosis. TTP left lumbar paraspinal region.  No midline or bony TTP. FROM with pain on flexion and extension. Strength LEs 5/5 all muscle groups.   2+ MSRs in patellar and achilles tendons, equal bilaterally. Negative SLRs. Sensation intact to light touch bilaterally. Negative logroll bilateral hips Negative fabers and piriformis stretches.    Assessment & Plan:  1. Lumbar radiculopathy - history consistent with this.  No red flags or concerning exam findings.  Start with prednisone dose pack, home exercises and stretches.  If not improving over  the next week would consider MRI.  Advised her to call us then with an update on her status.  Addendum:  MRI reviewed and discussed with patient.  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  We discussed options - she will try physical therapy and continue aleve twice a day.  Prednisone didn't seem to help much - will not try extended course at this time.  She will consider injection if not improving.  Follow up about a month after therapy.

## 2016-03-03 NOTE — Addendum Note (Signed)
Addended by: Sherrie George F on: 03/03/2016 03:43 PM   Modules accepted: Orders

## 2016-03-04 ENCOUNTER — Ambulatory Visit (HOSPITAL_BASED_OUTPATIENT_CLINIC_OR_DEPARTMENT_OTHER)
Admission: RE | Admit: 2016-03-04 | Discharge: 2016-03-04 | Disposition: A | Discharge status: Home / Self Care | Payer: BC Managed Care – PPO | Source: Ambulatory Visit | Attending: Family Medicine | Admitting: Family Medicine

## 2016-03-04 DIAGNOSIS — M5442 Lumbago with sciatica, left side: Secondary | ICD-10-CM | POA: Insufficient documentation

## 2016-03-04 IMAGING — DX DG LUMBAR SPINE 2-3V
3 series · 3 of 3 positions shown · non-contrast
Comparison: CT scan dated 03/26/2008

CLINICAL DATA: Left-sided low back pain and left-sided sciatica.

EXAM:
LUMBAR SPINE - 2-3 VIEW

[l-spine ap]
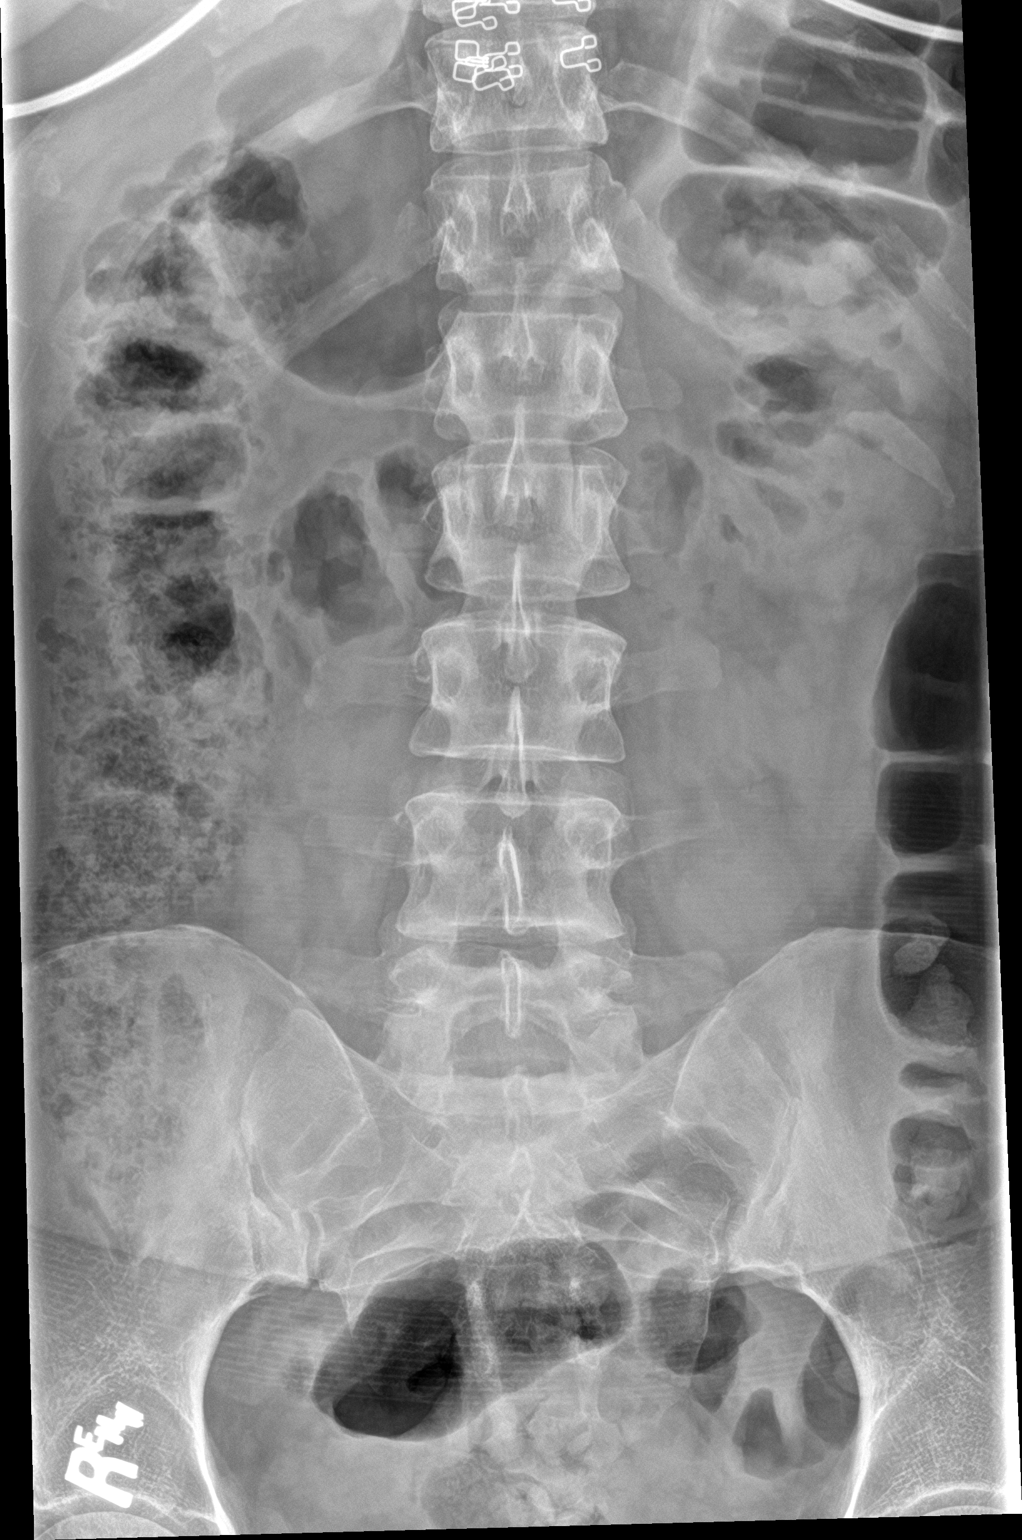

[l-spine lat]
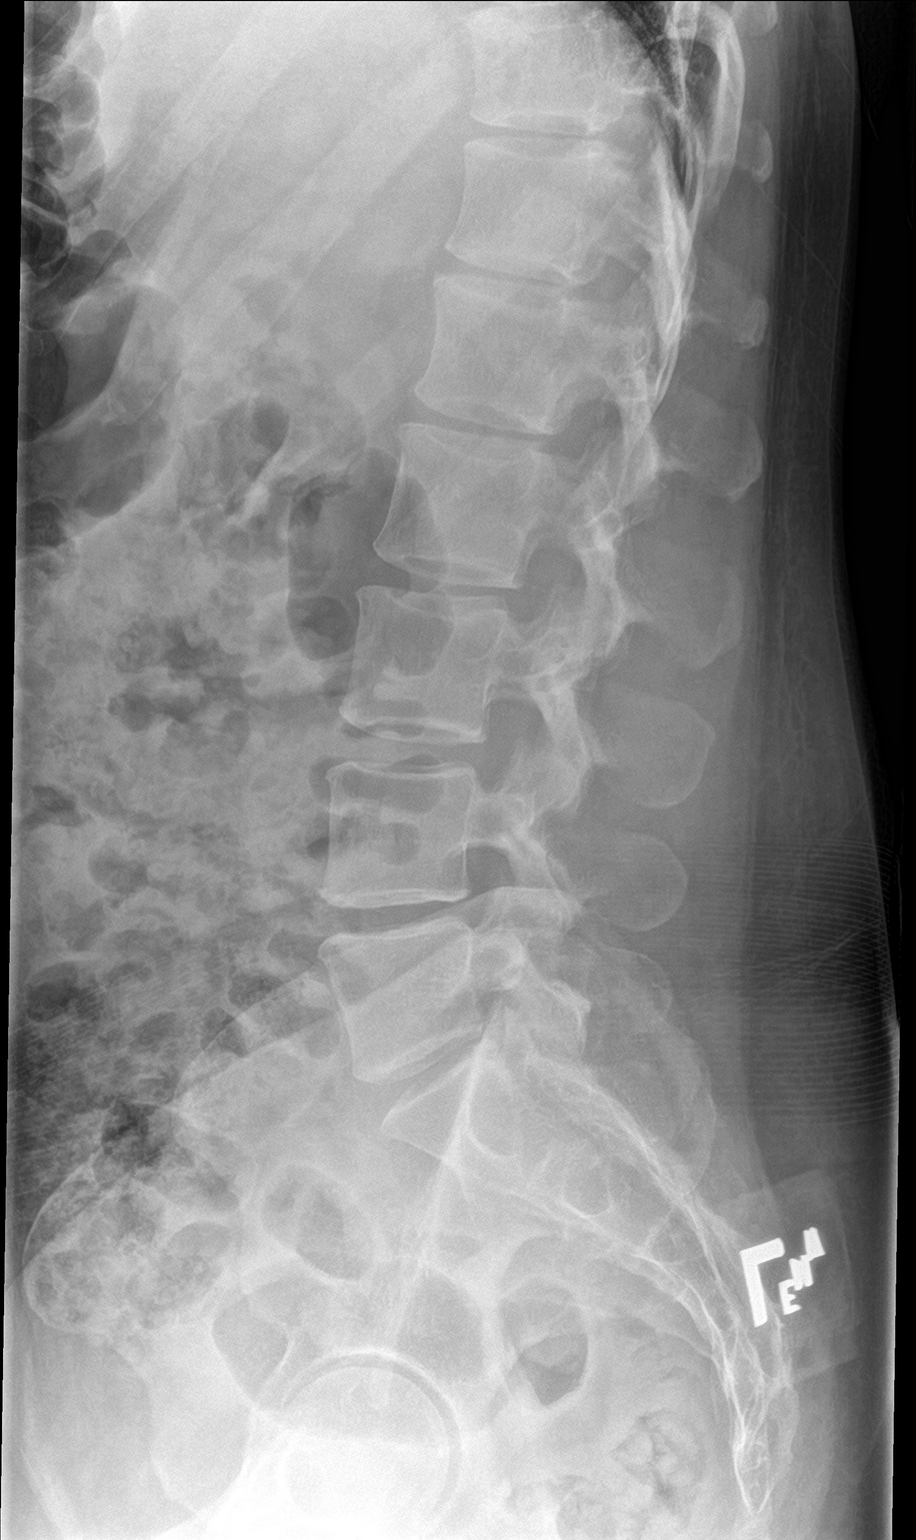

[l-spine spot]
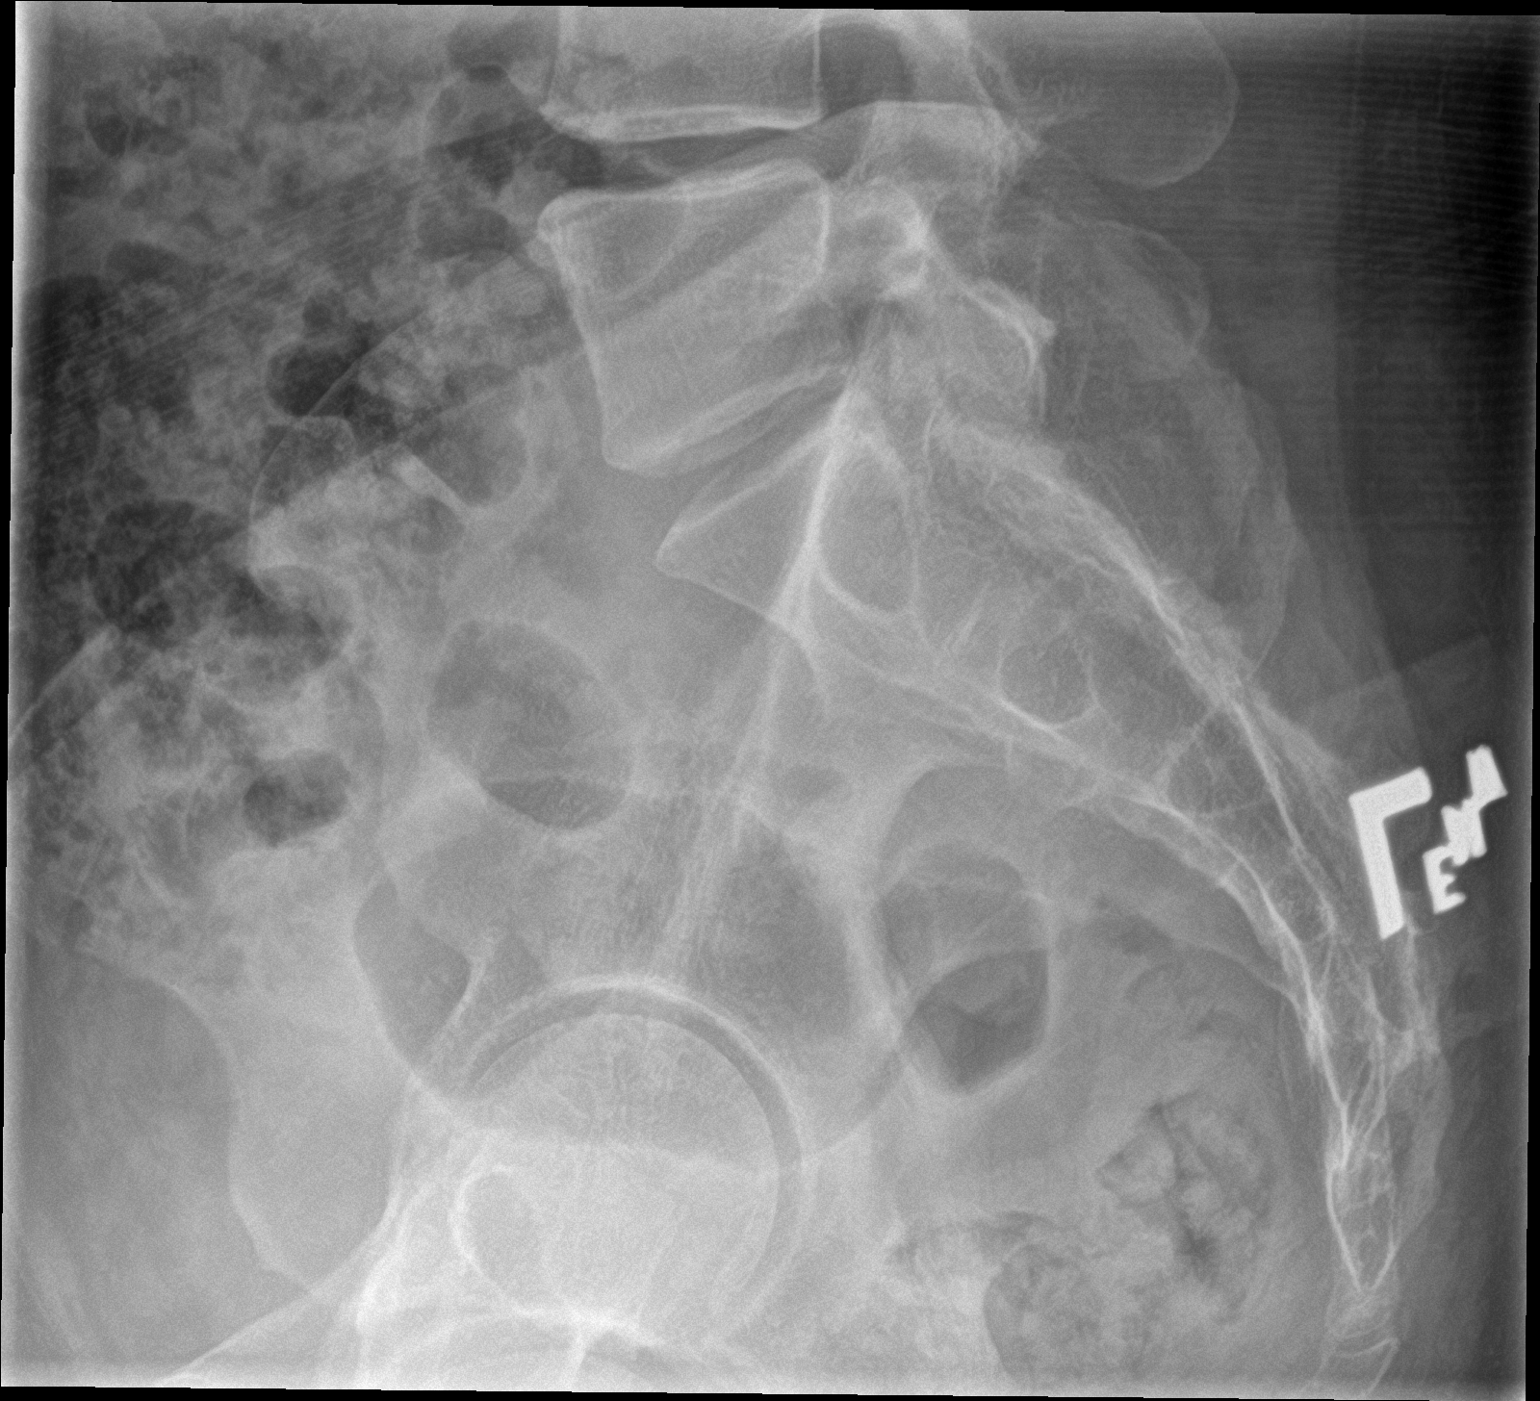

[3 of 3 positions shown; findings below may reference images not displayed]

FINDINGS: There is no evidence of lumbar spine fracture. Alignment is normal.
Intervertebral disc spaces are maintained.
IMPRESSION: Normal lumbar spine.

## 2016-03-05 NOTE — Addendum Note (Signed)
Addended by: Sherrie George F on: 03/05/2016 09:25 AM   Modules accepted: Orders

## 2016-03-06 ENCOUNTER — Ambulatory Visit (HOSPITAL_BASED_OUTPATIENT_CLINIC_OR_DEPARTMENT_OTHER)
Admission: RE | Admit: 2016-03-06 | Discharge: 2016-03-06 | Disposition: A | Discharge status: Home / Self Care | Payer: BC Managed Care – PPO | Source: Ambulatory Visit | Attending: Family Medicine | Admitting: Family Medicine

## 2016-03-06 DIAGNOSIS — M5127 Other intervertebral disc displacement, lumbosacral region: Secondary | ICD-10-CM | POA: Insufficient documentation

## 2016-03-06 DIAGNOSIS — M5136 Other intervertebral disc degeneration, lumbar region: Secondary | ICD-10-CM | POA: Diagnosis not present

## 2016-03-06 DIAGNOSIS — M5442 Lumbago with sciatica, left side: Secondary | ICD-10-CM | POA: Insufficient documentation

## 2016-03-06 IMAGING — MR MR LUMBAR SPINE W/O CM
4 of 5 series · 25 of 48 positions shown · non-contrast
Comparison: Lumbar radiographs 03/04/2016. CT Abdomen and Pelvis
03/26/2008.

CLINICAL DATA: 39-year-old female with lumbar back pain radiating
down the left side to the buttocks and left leg. Numbness and
tingling in the left leg. Symptoms for 3 weeks with no known injury.
Symptoms increase with certain stretching. Subsequent encounter.

EXAM:
MRI LUMBAR SPINE WITHOUT CONTRAST
TECHNIQUE: Multiplanar, multisequence MR imaging of the lumbar spine was
performed. No intravenous contrast was administered.

[Series 2: T1 · sagittal · 4.0mm · 0.51mm/px · 6 of 14 slices shown (1 of 2)]
[im 1/14]
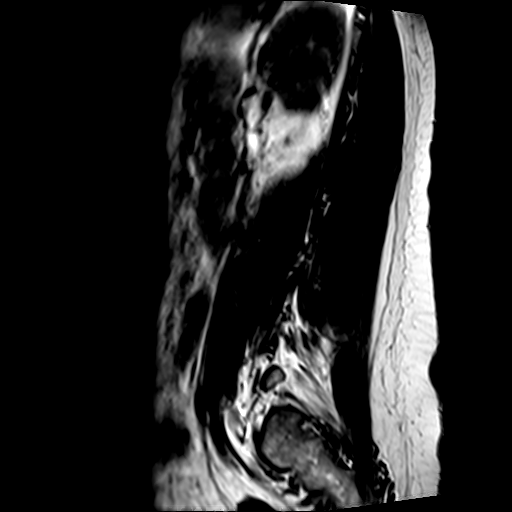
[im 3/14]
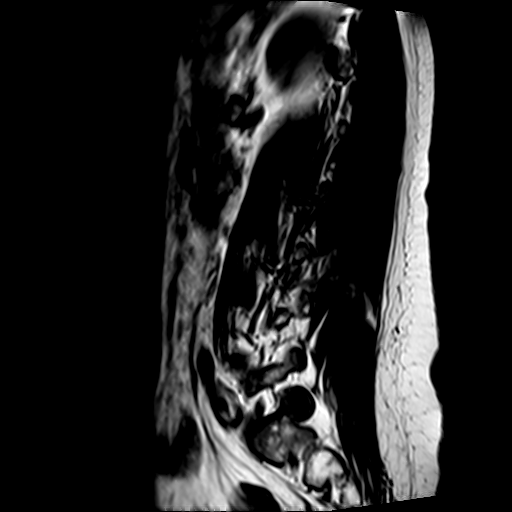
[im 6/14]
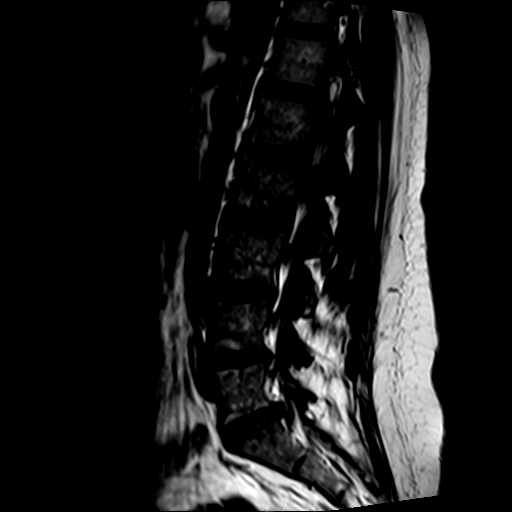
[im 8/14]
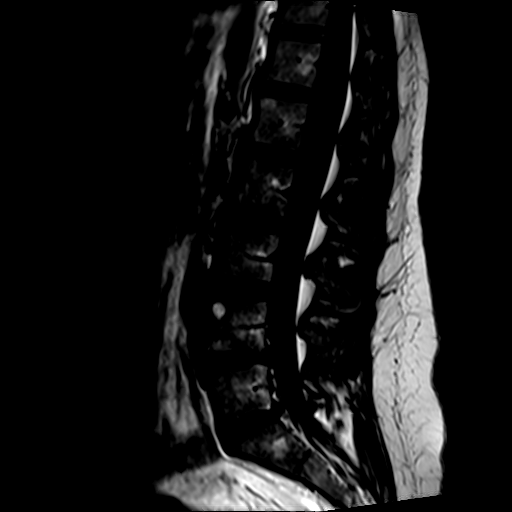
[im 11/14]
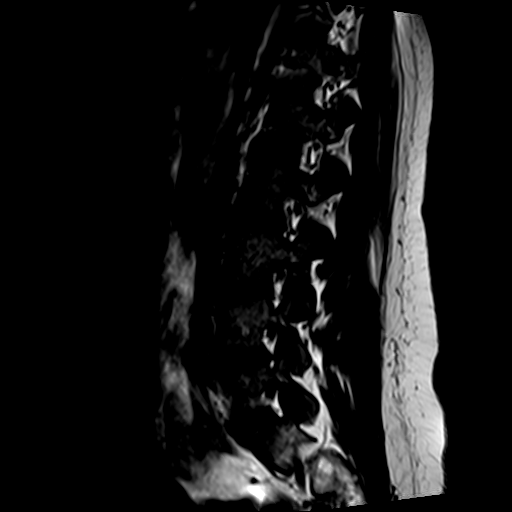
[im 14/14]
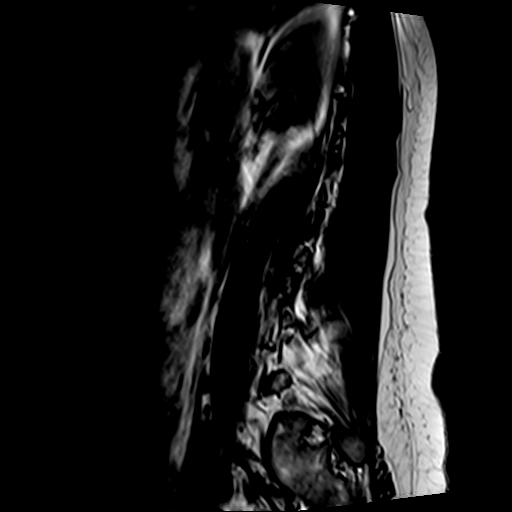

[Series 3: T2 · sagittal · 4.0mm · 0.81mm/px · 6 of 14 slices shown (1 of 2)]
[im 1/14]
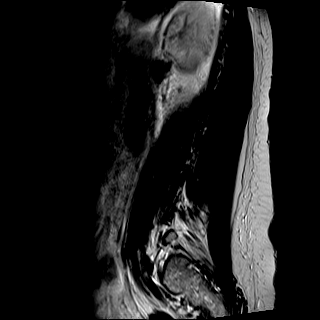
[im 3/14]
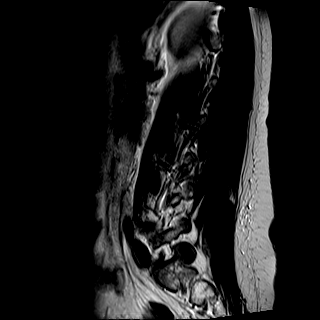
[im 6/14]
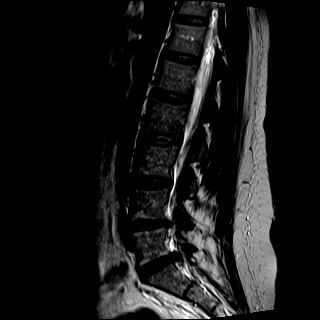
[im 8/14]
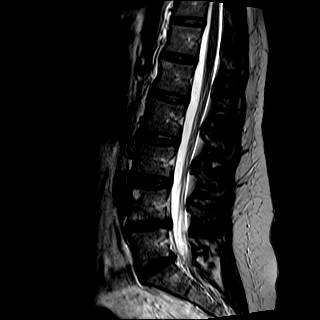
[im 11/14]
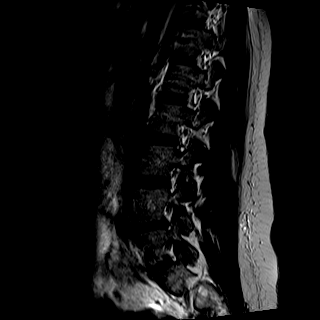
[im 14/14]
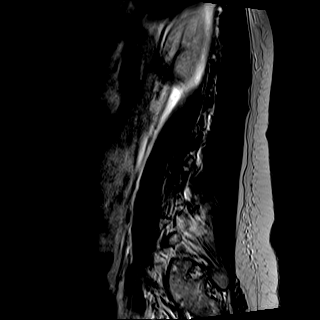

[Series 5: T2 · axial · 4.0mm · 0.39mm/px · z∈[-121,+76]mm · 9 of 34 slices shown (2 of 2)]
[im 1/34]
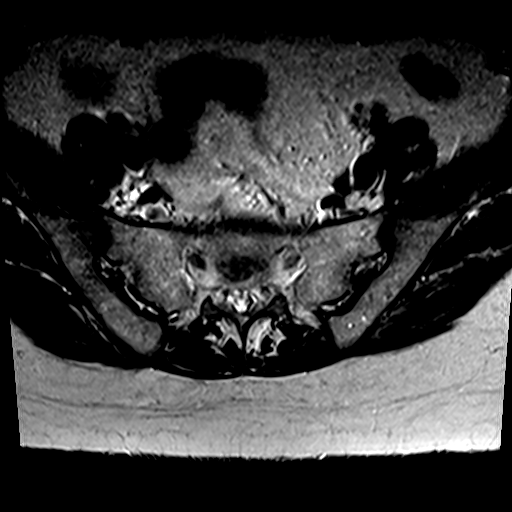
[im 5/34]
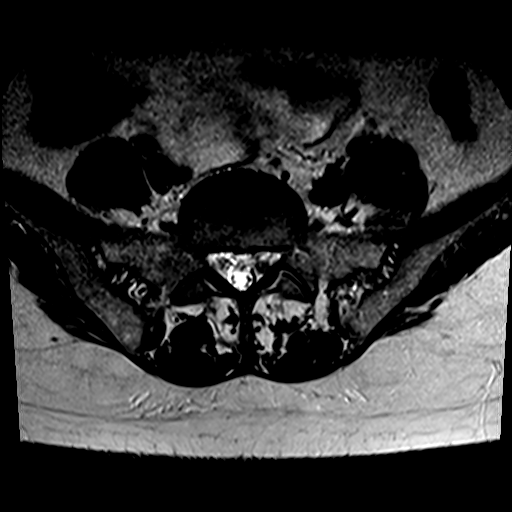
[im 10/34]
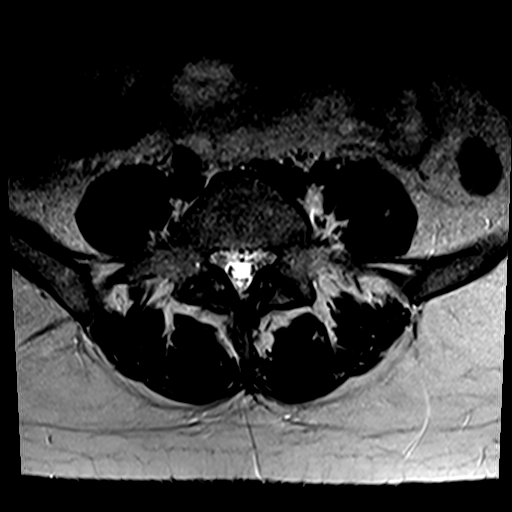
[im 15/34]
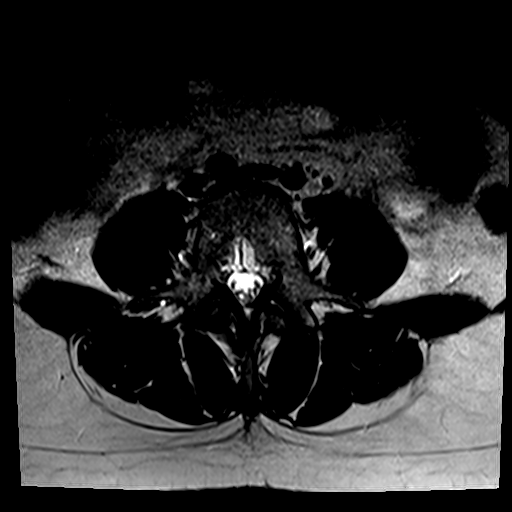
[im 17/34]
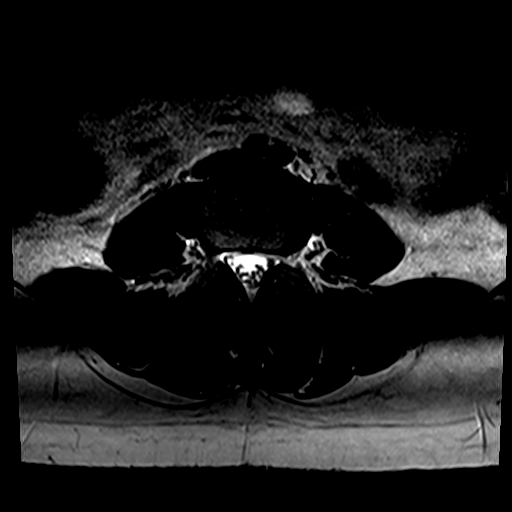
[im 19/34]
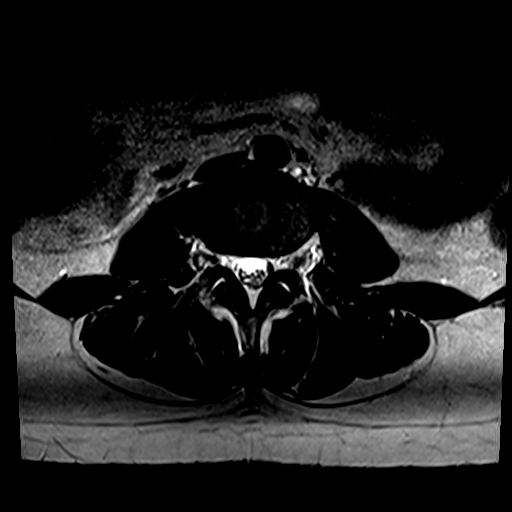
[im 24/34]
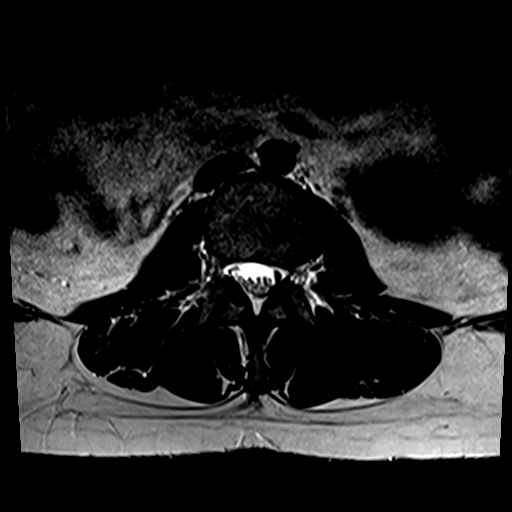
[im 29/34]
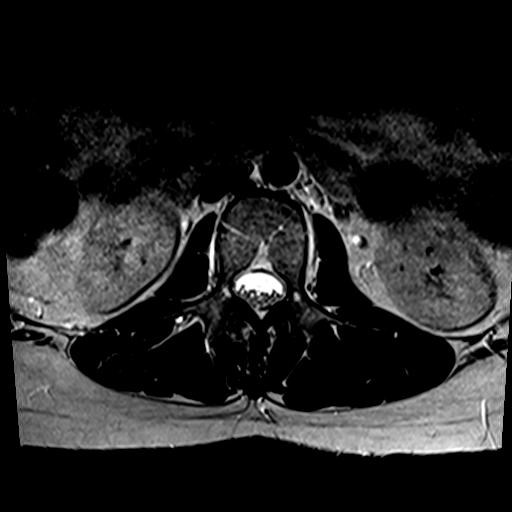
[im 34/34]
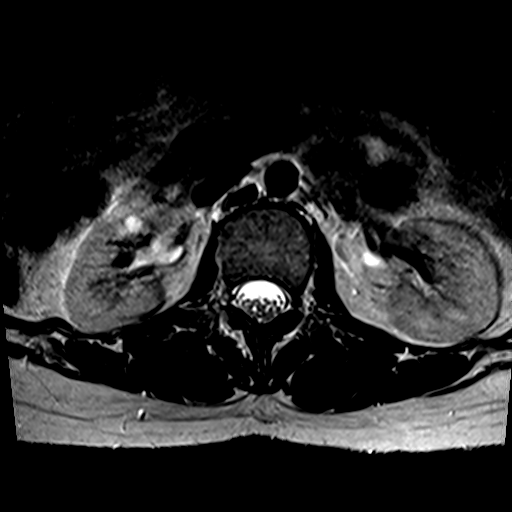

[Series 6: T1 · axial · 4.0mm · 0.78mm/px · z∈[-121,+51]mm · 4 of 34 slices shown (2 of 2)]
[im 1/34]
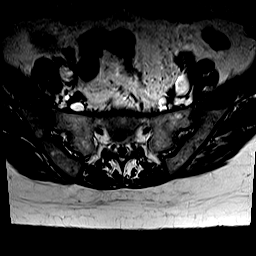
[im 5/34]
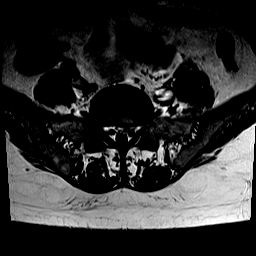
[im 17/34]
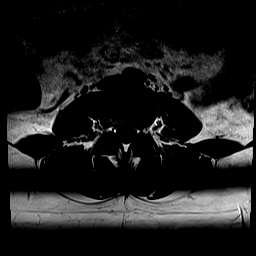
[im 29/34]
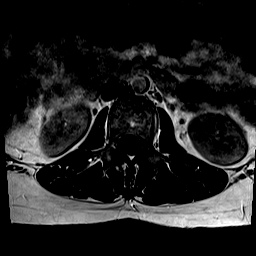

[25 of 48 positions shown; findings below may reference images not displayed]

FINDINGS: Segmentation:  Normal as seen on the recent radiographs.

Alignment:  Stable since 1443 and within normal limits.

Vertebrae: No marrow edema or evidence of acute osseous abnormality.

Conus medullaris: Extends to the L1 level and appears normal.

Paraspinal and other soft tissues: Visualized abdominal viscera and
paraspinal soft tissues are within normal limits.

Disc levels:

T11-T12:  Negative.

T12-L1:  Negative.

L1-L2:  Negative.

L2-L3:  Negative.

L3-L4:  Negative disc.  Mild facet hypertrophy.  No stenosis.

L4-L5: Disc desiccation. Mild circumferential disc bulge. Subtle
central annular fissure best seen on series 4, image 7. Borderline
to mild facet and ligament flavum hypertrophy. Borderline to mild
bilateral lateral recess stenosis (descending L5 nerve root levels).
No spinal or foraminal stenosis.

L5-S1: Disc desiccation. Mild circumferential disc bulge.
Superimposed left lateral recess small to moderate disc protrusion
(series 5, image 29). Mild facet and ligament flavum hypertrophy.
Moderate to severe left lateral recess stenosis at the level of the
descending S1 nerve roots. No significant spinal stenosis. No L5
foraminal involvement.
IMPRESSION: 1. Symptomatic level felt to be L5-S1 where a small to moderate size
disc herniation into the left lateral recess affects the descending
left S1 nerve roots. No spinal stenosis.
2. Mild disc and posterior element degeneration at L4-L5.

## 2016-03-08 NOTE — Addendum Note (Signed)
Addended by: Sherrie George F on: 03/08/2016 04:56 PM   Modules accepted: Orders

## 2016-03-09 ENCOUNTER — Ambulatory Visit: Payer: BC Managed Care – PPO | Attending: Family Medicine | Admitting: Physical Therapy

## 2016-03-09 DIAGNOSIS — M6281 Muscle weakness (generalized): Secondary | ICD-10-CM

## 2016-03-09 DIAGNOSIS — M5432 Sciatica, left side: Secondary | ICD-10-CM | POA: Diagnosis not present

## 2016-03-09 NOTE — Therapy (Signed)
[facility] [address], [place], [place], [address] Phone: [phone]   Fax:  [phone]  Physical Therapy Evaluation  Patient Details  Name: Anna Tran MRN: [id number] Date of Birth: [birth year] Referring Provider: Barbaraann Barthel  Encounter Date: 03/09/2016      PT End of Session - 03/09/16 1224    Visit Number 1   Date for PT Re-Evaluation 05/04/16   PT Start Time 1140   PT Stop Time 1223   PT Time Calculation (min) 43 min   Activity Tolerance Patient tolerated treatment well      Past Medical History:  Diagnosis Date  . Acid reflux   . Chicken pox   . Hemorrhoids   . Hyperlipidemia   . Tonsillitis     Past Surgical History:  Procedure Laterality Date  . CESAREAN SECTION     FTP  . CYSTECTOMY     cyst removed from groin  . WISDOM TOOTH EXTRACTION      There were no vitals filed for this visit.       Subjective Assessment - 03/09/16 1144    Subjective Left low back pain, buttock, posterior lower leg to heel for 1 month;  started after 1 morning after having night time [place];   no change over the past month;  Worse in the AM;  no benefit from steroid dose pack    Patient is accompained by: --  2 children   Pertinent History Had PT after had last child 4 years ago; time to time pain   Limitations House hold activities;Sitting   How long can you sit comfortably? could tolerate for a while   How long can you walk comfortably? as long as I want   Diagnostic tests MRI L5-S1 small to medium disc   Patient Stated Goals be out of pain   Currently in Pain? Yes   Pain Score 3    Pain Location Leg   Pain Orientation Left   Pain Type Acute pain   Pain Radiating Towards radiating left LE   Pain Onset 1 to 4 weeks ago   Pain Frequency Constant   Aggravating Factors  AM, nights, sit with left leg extended;     Pain Relieving Factors Alleve,  moving around            [facility] PT Assessment -  03/09/16 0001      Assessment   Medical Diagnosis left low back pain with left sciatica   Referring Provider [place]   Onset Date/Surgical Date --  1 month   Next MD Visit 1 month   Prior Therapy 4 years ago for LBP     Precautions   Precautions None     Restrictions   Weight Bearing Restrictions No     Balance Screen   Has the patient fallen in the past 6 months No   Has the patient had a decrease in activity level because of a fear of falling?  No   Is the patient reluctant to leave their home because of a fear of falling?  No     Home Environment   Living Environment Private residence   Type of [place] to enter   Home Layout Two level     Prior Function   Level of Independence Independent with basic ADLs   Vocation Part time employment   Vocation Requirements work at [employer] on weekends   Leisure hang out of  kids     Observation/Other Assessments   Focus on Therapeutic Outcomes (FOTO)  46% limitation      AROM   AROM Assessment Site Lumbar   Lumbar Flexion 35   Lumbar Extension 20   Lumbar - Right Side Bend 35   Lumbar - Left Side Bend 30     Strength   Strength Assessment Site Lumbar  Left LE 5/5   Lumbar Flexion 4-/5   Lumbar Extension 4-/5     Palpation   Palpation comment No tenderness in buttock or back     Slump test   Findings Positive   Side Left     Prone Knee Bend Test   Findings Negative     Straight Leg Raise   Findings Positive   Side  Left                           PT Education - 03/09/16 1222    Education provided Yes   Education Details trial of extensions; flexion avoidance, lumbar roll when sitting   Person(s) Educated Patient   Methods Explanation;Demonstration;Handout   Comprehension Verbalized understanding;Returned demonstration          PT Short Term Goals - 03/09/16 1916      PT SHORT TERM GOAL #1   Title The patient will demonstrate knowledge of basic self  care strategies including use of lumbar roll, body mechanics with household chores  04/06/16   Time 4   Period Weeks   Status New     PT SHORT TERM GOAL #2   Title The patient will report a 30% improvement in morning and night pain.     Time 4   Period Weeks   Status New     PT SHORT TERM GOAL #3   Title The patient will have centralized symptoms 50% of the time   Time 4   Period Weeks   Status New     PT SHORT TERM GOAL #4   Title Improved lumbar flexion to 45 degrees and lumbar extension to 25 degrees needed for household chores   Time 4   Period Weeks   Status New           PT Long Term Goals - 03/09/16 1919      PT LONG TERM GOAL #1   Title The patient will be independent in safe self progression of HEP needed for further improvement in strength and function   05/04/16   Time 8   Period Weeks   Status New     PT LONG TERM GOAL #2   Title The patient will report a 60% improvement in AM and night time pain   Time 8   Period Weeks   Status New     PT LONG TERM GOAL #3   Title The patient will have improved core/trunk strength to 4/5 needed for lifting and housework   Time 8   Period Weeks   Status New     PT LONG TERM GOAL #4   Title Patient will have neural mobility/hamstring muscle length to allow 60 degrees with SLR needed for improved mobilty with decreased leg pain with ADLs   Time 8   Period Weeks   Status New     PT LONG TERM GOAL #5   Title FOTO functional outcome score improved form 46% limitation to 27% limitation indicating improved function with less pain   Time 8   Period  Weeks   Status New               Plan - 03/09/16 1225    Clinical Impression Statement The patient is a 40 year female with a 1 month history of pain in left buttock, posterior thigh, calf to ankle.  Symptoms are worse in the morning and at night.   Pain and difficulty straightening out her left leg with bending over.   Relieved with taking Alleve.  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  No improvement with steroid dose pack.  Decreased lumbar AROM in all planes:  flex 35, ext 20, right sidebend 35, left sidebend 30.  Positive left SLR and slump tests.  Decreased core muscle strength.  Left LE motor and sensory intact.  Due to minimal co-morbidities and good supportive home environment the patient is of low complexity evaluation.      Rehab Potential Good   Clinical Impairments Affecting Rehab Potential history of LBP 4 years ago after birth of daughter, had PT;  some on/off pain but nothing with this severity.     PT Frequency 2x / week   PT Duration 8 weeks   PT Treatment/Interventions ADLs/Self Care Home Management;Cryotherapy;Electrical Stimulation;Moist Heat;Ultrasound;Traction;Therapeutic exercise;Neuromuscular re-education;Patient/family education;Manual techniques;Dry needling;Taping   PT Next Visit Plan assess response to lumbar extensions and progress to encourage centralization;  review postural correction and body mechanics;  modalities as needed for pain control;  neural flossing      Patient will benefit from skilled therapeutic intervention in order to improve the following deficits and impairments:  Pain, Decreased strength, Postural dysfunction, Decreased range of motion, Improper body mechanics  Visit Diagnosis: Sciatica, left side - Plan: PT plan of care cert/re-cert  Muscle weakness (generalized) - Plan: PT plan of care cert/re-cert     Problem List Patient Active Problem List   Diagnosis Date Noted  . Anal itching 11/29/2012  . External hemorrhoid 11/29/2012  . Internal hemorrhoid 11/29/2012  . Low back pain 12/17/2011  . postaprtum care following VBAC (2/10) 09/05/2011  . Previous cesarean delivery affecting pregnancy 09/04/2011   Ruben Im, PT 03/09/16 7:28 PM Phone: [phone] Fax: [phone]  Alvera Singh 03/09/2016, 7:28 PM  [facility] [address], [place], [place], [address] Phone: [phone]   Fax:  [phone]  Name: Anna Tran MRN: [id number] Date of Birth: 10-11-1975

## 2016-03-09 NOTE — Patient Instructions (Signed)
Stacy Simpson PT [facility] [address], [address] [place], [place] [address] Phone # [phone] Fax [phone]    

## 2016-03-09 NOTE — Telephone Encounter (Signed)
MRI was scheduled and completed.

## 2016-03-10 ENCOUNTER — Ambulatory Visit: Payer: BC Managed Care – PPO | Admitting: Physical Therapy

## 2016-03-11 ENCOUNTER — Encounter: Payer: Self-pay | Admitting: Physical Therapy

## 2016-03-11 ENCOUNTER — Ambulatory Visit: Payer: BC Managed Care – PPO | Admitting: Physical Therapy

## 2016-03-11 DIAGNOSIS — M5432 Sciatica, left side: Secondary | ICD-10-CM

## 2016-03-11 DIAGNOSIS — M6281 Muscle weakness (generalized): Secondary | ICD-10-CM

## 2016-03-11 NOTE — Therapy (Signed)
[facility] [address], [place], [place], [address] Phone: [phone]   Fax:  [phone]  Physical Therapy Treatment  Patient Details  Name: Anna Tran MRN: [id number] Date of Birth: 08-03-1975 Referring Provider: Barbaraann Barthel  Encounter Date: 03/11/2016      PT End of Session - 03/11/16 1140    Visit Number 2   Date for PT Re-Evaluation 05/04/16   PT Start Time 1102   PT Stop Time 1150   PT Time Calculation (min) 48 min   Activity Tolerance Patient tolerated treatment well   Behavior During Therapy [facility] for tasks assessed/performed      Past Medical History:  Diagnosis Date  . Acid reflux   . Chicken pox   . Hemorrhoids   . Hyperlipidemia   . Tonsillitis     Past Surgical History:  Procedure Laterality Date  . CESAREAN SECTION     FTP  . CYSTECTOMY     cyst removed from groin  . WISDOM TOOTH EXTRACTION      There were no vitals filed for this visit.      Subjective Assessment - 03/11/16 1105    Subjective Pt reports left leg hurting today. Lingering constant pain. Continues to have difficulty sleeping through night due to pain. Pt states that the only position that relieves pain is standing.    Pertinent History Had PT after had last child 4 years ago; time to time pain   Limitations House hold activities;Sitting   How long can you sit comfortably? could tolerate for a while   How long can you walk comfortably? as long as I want   Diagnostic tests MRI L5-S1 small to medium disc   Patient Stated Goals be out of pain   Currently in Pain? Yes   Pain Score 5    Pain Location Leg   Pain Orientation Left   Pain Type Acute pain   Pain Radiating Towards radiating left LE   Pain Onset 1 to 4 weeks ago   Pain Frequency Constant   Aggravating Factors  nights sitting with leg extended   Pain Relieving Factors Alleve moveing around   Multiple Pain Sites No                          [facility] Adult PT Treatment/Exercise - 03/11/16 0001      Exercises   Exercises Lumbar;Knee/Hip;Shoulder     Lumbar Exercises: Prone   Other Prone Lumbar Exercises Mackinzie progression     Knee/Hip Exercises: Aerobic   Stationary Bike 8 mins     Knee/Hip Exercises: Standing   Hip Abduction AROM;Both;2 sets   Hip Extension AROM;Both;2 sets     Knee/Hip Exercises: Seated   Hamstring Curl AROM;Both;2 sets     Knee/Hip Exercises: Supine   Short Arc Quad Sets AROM;Strengthening;Both;1 set   Short Arc Quad Sets Limitations Limited knee extension left                  PT Short Term Goals - 03/09/16 1916      PT SHORT TERM GOAL #1   Title The patient will demonstrate knowledge of basic self care strategies including use of lumbar roll, body mechanics with household chores  04/06/16   Time 4   Period Weeks   Status New     PT SHORT TERM GOAL #2   Title The patient will report a 30% improvement in morning and night pain.  Time 4   Period Weeks   Status New     PT SHORT TERM GOAL #3   Title The patient will have centralized symptoms 50% of the time   Time 4   Period Weeks   Status New     PT SHORT TERM GOAL #4   Title Improved lumbar flexion to 45 degrees and lumbar extension to 25 degrees needed for household chores   Time 4   Period Weeks   Status New           PT Long Term Goals - 03/09/16 1919      PT LONG TERM GOAL #1   Title The patient will be independent in safe self progression of HEP needed for further improvement in strength and function   05/04/16   Time 8   Period Weeks   Status New     PT LONG TERM GOAL #2   Title The patient will report a 60% improvement in AM and night time pain   Time 8   Period Weeks   Status New     PT LONG TERM GOAL #3   Title The patient will have improved core/trunk strength to 4/5 needed for lifting and housework   Time 8   Period Weeks   Status New     PT LONG TERM GOAL #4   Title Patient will have  neural mobility/hamstring muscle length to allow 60 degrees with SLR needed for improved mobilty with decreased leg pain with ADLs   Time 8   Period Weeks   Status New     PT LONG TERM GOAL #5   Title FOTO functional outcome score improved form 46% limitation to 27% limitation indicating improved function with less pain   Time 8   Period Weeks   Status New             Patient will benefit from skilled therapeutic intervention in order to improve the following deficits and impairments:     Visit Diagnosis: Sciatica, left side  Muscle weakness (generalized)     Problem List Patient Active Problem List   Diagnosis Date Noted  . Anal itching 11/29/2012  . External hemorrhoid 11/29/2012  . Internal hemorrhoid 11/29/2012  . Low back pain 12/17/2011  . postaprtum care following VBAC (2/10) 09/05/2011  . Previous cesarean delivery affecting pregnancy 09/04/2011    Mikle Bosworth PTA 03/11/2016, 11:41 AM  [facility] [address], [place], [place], [address] Phone: [phone]   Fax:  [phone]  Name: Anna Tran MRN: [id number] Date of Birth: 11-May-1976

## 2016-03-16 ENCOUNTER — Ambulatory Visit: Payer: BC Managed Care – PPO | Admitting: Physical Therapy

## 2016-03-16 DIAGNOSIS — M5432 Sciatica, left side: Secondary | ICD-10-CM | POA: Diagnosis not present

## 2016-03-16 DIAGNOSIS — M6281 Muscle weakness (generalized): Secondary | ICD-10-CM

## 2016-03-16 NOTE — Therapy (Signed)
[facility] [address], [place], [place], [address] Phone: [phone]   Fax:  [phone]  Physical Therapy Treatment  Patient Details  Name: Anna Tran MRN: [id number] Date of Birth: 1975/08/26 Referring Provider: Barbaraann Barthel  Encounter Date: 03/16/2016      PT End of Session - 03/16/16 1653    Visit Number 3   Date for PT Re-Evaluation 05/04/16   PT Start Time 1615   PT Stop Time 1705   PT Time Calculation (min) 50 min   Activity Tolerance Patient tolerated treatment well   Behavior During Therapy [facility] for tasks assessed/performed      Past Medical History:  Diagnosis Date  . Acid reflux   . Chicken pox   . Hemorrhoids   . Hyperlipidemia   . Tonsillitis     Past Surgical History:  Procedure Laterality Date  . CESAREAN SECTION     FTP  . CYSTECTOMY     cyst removed from groin  . WISDOM TOOTH EXTRACTION      There were no vitals filed for this visit.      Subjective Assessment - 03/16/16 1617    Subjective Pt reports noticing a change in stiffness in the AM. Reports pain is still present in the AM but mostly just in back. Pt reports having some pain in lateral and anterior hip as well.    Pertinent History Had PT after had last child 4 years ago; time to time pain   Limitations House hold activities;Sitting   How long can you sit comfortably? could tolerate for a while   How long can you walk comfortably? as long as I want   Diagnostic tests MRI L5-S1 small to medium disc   Patient Stated Goals be out of pain   Currently in Pain? Yes   Pain Score 5    Pain Location Leg   Pain Orientation Left   Pain Type Chronic pain   Pain Radiating Towards Raidiating down Lt leg   Pain Onset 1 to 4 weeks ago   Pain Frequency Constant   Aggravating Factors  Night time, sitting with leg extended   Pain Relieving Factors Alleve, moveing around   Multiple Pain Sites No                         [facility] Adult PT Treatment/Exercise - 03/16/16 0001      Lumbar Exercises: Supine   Bridge 20 reps   Straight Leg Raise 20 reps     Knee/Hip Exercises: Sidelying   Hip ABduction AROM;Strengthening;Both;2 sets;10 reps   Hip ADduction AROM;Strengthening;2 sets;10 reps   Clams 20  red T band     Knee/Hip Exercises: Prone   Hamstring Curl 2 sets;10 reps   Hip Extension AROM;Strengthening;Both;2 sets;10 reps     Modalities   Modalities Electrical Stimulation;Moist Heat     Moist Heat Therapy   Number Minutes Moist Heat 15 Minutes   Moist Heat Location Lumbar Spine  Lt hamstring and Lt SI                  PT Short Term Goals - 03/16/16 1710      PT SHORT TERM GOAL #1   Title The patient will demonstrate knowledge of basic self care strategies including use of lumbar roll, body mechanics with household chores  04/06/16   Time 4   Period Weeks   Status On-going     PT  SHORT TERM GOAL #2   Title The patient will report a 30% improvement in morning and night pain.     Time 4   Period Weeks   Status On-going     PT SHORT TERM GOAL #3   Title The patient will have centralized symptoms 50% of the time   Time 4   Period Weeks   Status On-going     PT SHORT TERM GOAL #4   Title Improved lumbar flexion to 45 degrees and lumbar extension to 25 degrees needed for household chores   Time 4   Period Weeks   Status On-going           PT Long Term Goals - 03/16/16 1710      PT LONG TERM GOAL #1   Title The patient will be independent in safe self progression of HEP needed for further improvement in strength and function   05/04/16   Time 8   Period Weeks   Status On-going     PT LONG TERM GOAL #2   Title The patient will report a 60% improvement in AM and night time pain   Time 8   Period Weeks   Status On-going     PT LONG TERM GOAL #3   Title The patient will have improved core/trunk strength to 4/5 needed for lifting and housework   Time 8   Period  Weeks   Status On-going     PT LONG TERM GOAL #4   Title Patient will have neural mobility/hamstring muscle length to allow 60 degrees with SLR needed for improved mobilty with decreased leg pain with ADLs   Time 8   Period Weeks   Status On-going     PT LONG TERM GOAL #5   Title FOTO functional outcome score improved form 46% limitation to 27% limitation indicating improved function with less pain   Time 8   Period Weeks   Status On-going               Plan - 03/16/16 1657    Clinical Impression Statement Pt continues to have pain radiating down Lt LE. Able to tolerate strengthening exercises on mat well staying in comfortable hip flexion ROM. No tenderness to palpate in low back, piriformis, or glutes. Pt reports pain is so deep that she cant reach pain with just her hands.  Will continue to strengthen and stabilize core and pelvis. Continue to decrease pain in Lt LE.    Rehab Potential Good   Clinical Impairments Affecting Rehab Potential history of LBP 4 years ago after birth of daughter, had PT;  some on/off pain but nothing with this severity.     PT Frequency 2x / week   PT Duration 8 weeks   PT Treatment/Interventions ADLs/Self Care Home Management;Cryotherapy;Electrical Stimulation;Moist Heat;Ultrasound;Traction;Therapeutic exercise;Neuromuscular re-education;Patient/family education;Manual techniques;Dry needling;Taping   PT Next Visit Plan Review proper body mechanics. Asses progression with lumbar extension. Treat pain as needed.   Consulted and Agree with Plan of Care Patient      Patient will benefit from skilled therapeutic intervention in order to improve the following deficits and impairments:  Pain, Decreased strength, Postural dysfunction, Decreased range of motion, Improper body mechanics  Visit Diagnosis: Sciatica, left side  Muscle weakness (generalized)     Problem List Patient Active Problem List   Diagnosis Date Noted  . Anal itching  11/29/2012  . External hemorrhoid 11/29/2012  . Internal hemorrhoid 11/29/2012  . Low back pain 12/17/2011  .  postaprtum care following VBAC (2/10) 09/05/2011  . Previous cesarean delivery affecting pregnancy 09/04/2011    Mikle Bosworth PTA 03/16/2016, 5:11 PM  [facility] [address] [place], [place], [address] Phone: [phone]   Fax:  [phone]  Name: Anna Tran MRN: [id number] Date of Birth: 1975-10-13

## 2016-03-17 ENCOUNTER — Ambulatory Visit: Payer: BC Managed Care – PPO | Admitting: Physical Therapy

## 2016-03-17 ENCOUNTER — Encounter: Payer: Self-pay | Admitting: Physical Therapy

## 2016-03-17 DIAGNOSIS — M5432 Sciatica, left side: Secondary | ICD-10-CM

## 2016-03-17 DIAGNOSIS — M6281 Muscle weakness (generalized): Secondary | ICD-10-CM

## 2016-03-17 NOTE — Therapy (Signed)
[facility] [address], [place], [place], [address] Phone: [phone]   Fax:  [phone]  Physical Therapy Treatment  Patient Details  Name: Anna Tran MRN: [id number] Date of Birth: [birth year] Referring Provider: Barbaraann Barthel  Encounter Date: 03/17/2016      PT End of Session - 03/17/16 1656    Visit Number 4   Date for PT Re-Evaluation 05/04/16   PT Start Time 0109   PT Stop Time 1703   PT Time Calculation (min) 47 min   Activity Tolerance Patient tolerated treatment well   Behavior During Therapy [facility] for tasks assessed/performed      Past Medical History:  Diagnosis Date  . Acid reflux   . Chicken pox   . Hemorrhoids   . Hyperlipidemia   . Tonsillitis     Past Surgical History:  Procedure Laterality Date  . CESAREAN SECTION     FTP  . CYSTECTOMY     cyst removed from groin  . WISDOM TOOTH EXTRACTION      There were no vitals filed for this visit.      Subjective Assessment - 03/17/16 1620    Subjective Pt reports pain is worse today. No pain relief with Alleve. Increased pain in Lt SI down into calf muscle.   Pertinent History Had PT after had last child 4 years ago; time to time pain   Limitations House hold activities;Sitting   How long can you sit comfortably? could tolerate for a while   How long can you walk comfortably? as long as I want   Diagnostic tests MRI L5-S1 small to medium disc   Patient Stated Goals be out of pain   Currently in Pain? Yes   Pain Score 7    Pain Location Leg   Pain Orientation Left   Pain Type Chronic pain   Pain Radiating Towards Raidiating down Lt leg   Pain Onset 1 to 4 weeks ago   Pain Frequency Constant   Aggravating Factors  Night time sitting with leg extended   Pain Relieving Factors Alleve, moveing around   Multiple Pain Sites No                         [facility] Adult PT Treatment/Exercise - 03/17/16 0001      Lumbar  Exercises: Stretches   Single Knee to Chest Stretch 20 seconds;3 reps     Shoulder Exercises: Supine   Other Supine Exercises Sitting on ball stability   Other Supine Exercises Seated neural flossing     Modalities   Modalities Electrical Stimulation;Moist Heat     Moist Heat Therapy   Number Minutes Moist Heat 15 Minutes   Moist Heat Location Lumbar Spine     Electrical Stimulation   Electrical Stimulation Location Lt SI joint and hamstrings   Electrical Stimulation Action IFC   Electrical Stimulation Parameters 14   Electrical Stimulation Goals Pain     Manual Therapy   Manual Therapy Soft tissue mobilization                  PT Short Term Goals - 03/17/16 1653      PT SHORT TERM GOAL #1   Title The patient will demonstrate knowledge of basic self care strategies including use of lumbar roll, body mechanics with household chores  04/06/16   Time 4   Period Weeks   Status Achieved     PT SHORT TERM  GOAL #3   Title The patient will have centralized symptoms 50% of the time           PT Long Term Goals - 03/16/16 1710      PT LONG TERM GOAL #1   Title The patient will be independent in safe self progression of HEP needed for further improvement in strength and function   05/04/16   Time 8   Period Weeks   Status On-going     PT LONG TERM GOAL #2   Title The patient will report a 60% improvement in AM and night time pain   Time 8   Period Weeks   Status On-going     PT LONG TERM GOAL #3   Title The patient will have improved core/trunk strength to 4/5 needed for lifting and housework   Time 8   Period Weeks   Status On-going     PT LONG TERM GOAL #4   Title Patient will have neural mobility/hamstring muscle length to allow 60 degrees with SLR needed for improved mobilty with decreased leg pain with ADLs   Time 8   Period Weeks   Status On-going     PT LONG TERM GOAL #5   Title FOTO functional outcome score improved form 46% limitation to 27%  limitation indicating improved function with less pain   Time 8   Period Weeks   Status On-going               Plan - 03/17/16 1700    Clinical Impression Statement Pt continues to have pain radiating down Lt LE. Increased tightness in hamstrings. Pain in Lt SI joint. Pt has limited Knee extension due to tightness and pain.  Disscussed dry needle therapy with patient and traction as possible options for next visit. Will continue to decrease pain and increase core stability as tolerated.    Rehab Potential Good   Clinical Impairments Affecting Rehab Potential history of LBP 4 years ago after birth of daughter, had PT;  some on/off pain but nothing with this severity.     PT Frequency 2x / week   PT Duration 8 weeks   PT Treatment/Interventions ADLs/Self Care Home Management;Cryotherapy;Electrical Stimulation;Moist Heat;Ultrasound;Traction;Therapeutic exercise;Neuromuscular re-education;Patient/family education;Manual techniques;Dry needling;Taping   PT Next Visit Plan Dry needleing, traction, neural flossing progression      Patient will benefit from skilled therapeutic intervention in order to improve the following deficits and impairments:  Pain, Decreased strength, Postural dysfunction, Decreased range of motion, Improper body mechanics  Visit Diagnosis: Sciatica, left side  Muscle weakness (generalized)     Problem List Patient Active Problem List   Diagnosis Date Noted  . Anal itching 11/29/2012  . External hemorrhoid 11/29/2012  . Internal hemorrhoid 11/29/2012  . Low back pain 12/17/2011  . postaprtum care following VBAC (2/10) 09/05/2011  . Previous cesarean delivery affecting pregnancy 09/04/2011    Mikle Bosworth PTA 03/17/2016, 5:10 PM  [facility] [address] [place], [place], [address] Phone: [phone]   Fax:  [phone]  Name: Anna Tran MRN: [id number] Date of Birth:  12-Jan-1976

## 2016-03-18 ENCOUNTER — Encounter: Payer: BC Managed Care – PPO | Admitting: Physical Therapy

## 2016-03-23 ENCOUNTER — Encounter: Payer: BC Managed Care – PPO | Admitting: Physical Therapy

## 2016-03-24 ENCOUNTER — Ambulatory Visit: Payer: BC Managed Care – PPO

## 2016-03-24 DIAGNOSIS — M6281 Muscle weakness (generalized): Secondary | ICD-10-CM

## 2016-03-24 DIAGNOSIS — M5432 Sciatica, left side: Secondary | ICD-10-CM

## 2016-03-24 NOTE — Therapy (Signed)
[facility] [address] [place], [place], [address] Phone: [phone]   Fax:  [phone]  Physical Therapy Treatment  Patient Details  Name: Anna Tran MRN: [id number] Date of Birth: [birth year] Referring Provider: Barbaraann Barthel  Encounter Date: 03/24/2016      PT End of Session - 03/24/16 0925    Visit Number 5   Date for PT Re-Evaluation 05/04/16   PT Start Time 0850   PT Stop Time 0939   PT Time Calculation (min) 49 min   Activity Tolerance Patient tolerated treatment well   Behavior During Therapy [facility] for tasks assessed/performed      Past Medical History:  Diagnosis Date  . Acid reflux   . Chicken pox   . Hemorrhoids   . Hyperlipidemia   . Tonsillitis     Past Surgical History:  Procedure Laterality Date  . CESAREAN SECTION     FTP  . CYSTECTOMY     cyst removed from groin  . WISDOM TOOTH EXTRACTION      There were no vitals filed for this visit.      Subjective Assessment - 03/24/16 0852    Subjective Pt had increased pain after last session.     Currently in Pain? Yes   Pain Score 5    Pain Location Leg   Pain Orientation Left   Pain Descriptors / Indicators Burning;Dull;Aching   Pain Type Chronic pain   Pain Onset 1 to 4 weeks ago   Pain Frequency Constant   Aggravating Factors  sleep, sometimes unknown, bending over   Pain Relieving Factors aleve, moving around                         [facility] Adult PT Treatment/Exercise - 03/24/16 0001      Modalities   Modalities Traction     Traction   Type of Traction Lumbar   Min (lbs) 30   Max (lbs) 70   Hold Time 60   Rest Time 20   Time 15     Manual Therapy   Manual Therapy Soft tissue mobilization   Manual therapy comments soft tissue to Lt gluteals and piriformis          Trigger Point Dry Needling - 03/24/16 0854    Consent Given? Yes   Education Handout Provided Yes   Muscles Treated Lower Body Gluteus  maximus;Gluteus minimus;Piriformis   Gluteus Maximus Response Twitch response elicited;Palpable increased muscle length   Gluteus Minimus Response Twitch response elicited;Palpable increased muscle length   Piriformis Response Twitch response elicited;Palpable increased muscle length              PT Education - 03/24/16 0857    Education provided Yes   Education Details DN info   Person(s) Educated Patient   Methods Explanation;Demonstration;Handout   Comprehension Verbalized understanding;Returned demonstration          PT Short Term Goals - 03/24/16 0854      PT SHORT TERM GOAL #1   Title The patient will demonstrate knowledge of basic self care strategies including use of lumbar roll, body mechanics with household chores  04/06/16   Status Achieved     PT SHORT TERM GOAL #2   Title The patient will report a 30% improvement in morning and night pain.     Time 4   Period Weeks   Status On-going     PT SHORT TERM GOAL #3  Title The patient will have centralized symptoms 50% of the time   Time 4   Period Weeks   Status On-going           PT Long Term Goals - 03/16/16 1710      PT LONG TERM GOAL #1   Title The patient will be independent in safe self progression of HEP needed for further improvement in strength and function   05/04/16   Time 8   Period Weeks   Status On-going     PT LONG TERM GOAL #2   Title The patient will report a 60% improvement in AM and night time pain   Time 8   Period Weeks   Status On-going     PT LONG TERM GOAL #3   Title The patient will have improved core/trunk strength to 4/5 needed for lifting and housework   Time 8   Period Weeks   Status On-going     PT LONG TERM GOAL #4   Title Patient will have neural mobility/hamstring muscle length to allow 60 degrees with SLR needed for improved mobilty with decreased leg pain with ADLs   Time 8   Period Weeks   Status On-going     PT LONG TERM GOAL #5   Title FOTO  functional outcome score improved form 46% limitation to 27% limitation indicating improved function with less pain   Time 8   Period Weeks   Status On-going               Plan - 03/24/16 0929    Clinical Impression Statement Pt without change in pain since the start of care with continued Lt LE radiulopathy.  Pt with trigger points in Lt gluteals and demonstrated improved tissue mobility after dry needling today.  Trial of lumbar traction today to address Lt LE radiculopathy.  Pt will continue to benefit from skilled PT for strength, flexiblity and traction/manual as needed.   Rehab Potential Good   Clinical Impairments Affecting Rehab Potential history of LBP 4 years ago after birth of daughter, had PT;  some on/off pain but nothing with this severity.     PT Frequency 2x / week   PT Duration 8 weeks   PT Treatment/Interventions ADLs/Self Care Home Management;Cryotherapy;Electrical Stimulation;Moist Heat;Ultrasound;Traction;Therapeutic exercise;Neuromuscular re-education;Patient/family education;Manual techniques;Dry needling;Taping   PT Next Visit Plan assess response to DN and traction, modalities, gentle flexibility, core strength   Consulted and Agree with Plan of Care Patient      Patient will benefit from skilled therapeutic intervention in order to improve the following deficits and impairments:  Pain, Decreased strength, Postural dysfunction, Decreased range of motion, Improper body mechanics  Visit Diagnosis: Sciatica, left side  Muscle weakness (generalized)     Problem List Patient Active Problem List   Diagnosis Date Noted  . Anal itching 11/29/2012  . External hemorrhoid 11/29/2012  . Internal hemorrhoid 11/29/2012  . Low back pain 12/17/2011  . postaprtum care following VBAC (2/10) 09/05/2011  . Previous cesarean delivery affecting pregnancy 09/04/2011     Sigurd Sos, PT 03/24/16 9:36 AM  [facility] [address] [place], [place], [address] Phone: [phone]   Fax:  [phone]  Name: Anna Tran MRN: [id number] Date of Birth: June 24, 1976

## 2016-03-24 NOTE — Patient Instructions (Addendum)

## 2016-03-25 ENCOUNTER — Encounter: Payer: Self-pay | Admitting: Physical Therapy

## 2016-03-25 ENCOUNTER — Encounter: Payer: BC Managed Care – PPO | Admitting: Physical Therapy

## 2016-03-25 ENCOUNTER — Ambulatory Visit: Payer: BC Managed Care – PPO | Admitting: Physical Therapy

## 2016-03-25 DIAGNOSIS — M5432 Sciatica, left side: Secondary | ICD-10-CM

## 2016-03-25 DIAGNOSIS — M6281 Muscle weakness (generalized): Secondary | ICD-10-CM

## 2016-03-25 NOTE — Therapy (Signed)
[facility] [address], [place], [place], [address] Phone: [phone]   Fax:  [phone]  Physical Therapy Treatment  Patient Details  Name: Anna Tran MRN: [id number] Date of Birth: 1975-08-29 Referring Provider: Barbaraann Barthel  Encounter Date: 03/25/2016      PT End of Session - 03/25/16 0856    Visit Number 6   Date for PT Re-Evaluation 05/04/16   PT Start Time 0856  patient was late   PT Stop Time 0940   PT Time Calculation (min) 44 min   Activity Tolerance Patient tolerated treatment well   Behavior During Therapy [facility] for tasks assessed/performed      Past Medical History:  Diagnosis Date  . Acid reflux   . Chicken pox   . Hemorrhoids   . Hyperlipidemia   . Tonsillitis     Past Surgical History:  Procedure Laterality Date  . CESAREAN SECTION     FTP  . CYSTECTOMY     cyst removed from groin  . WISDOM TOOTH EXTRACTION      There were no vitals filed for this visit.      Subjective Assessment - 03/25/16 0856    Subjective I feel better. I do not have as sharp pain down left leg. The dry needling has helped. I can now sleep in other positions.    Pertinent History Had PT after had last child 4 years ago; time to time pain   Limitations House hold activities;Sitting   How long can you sit comfortably? could tolerate for a while   How long can you walk comfortably? as long as I want   Diagnostic tests MRI L5-S1 small to medium disc   Patient Stated Goals be out of pain   Currently in Pain? Yes   Pain Score 3    Pain Location Leg   Pain Orientation Left   Pain Descriptors / Indicators Burning;Dull;Aching   Pain Type Chronic pain   Pain Radiating Towards radiating down left leg   Pain Onset 1 to 4 weeks ago   Pain Frequency Constant   Aggravating Factors  sleep, bending over   Pain Relieving Factors moving around   Multiple Pain Sites No                         [facility]  Adult PT Treatment/Exercise - 03/25/16 0001      Traction   Type of Traction Lumbar   Min (lbs) 30   Max (lbs) 70   Hold Time 60   Rest Time 20   Time 15     Manual Therapy   Manual Therapy Soft tissue mobilization   Manual therapy comments soft tissue to Lt gluteals and piriformis          Trigger Point Dry Needling - 03/24/16 0854    Consent Given? Yes   Education Handout Provided Yes   Muscles Treated Lower Body Gluteus maximus;Gluteus minimus;Piriformis   Gluteus Maximus Response Twitch response elicited;Palpable increased muscle length   Gluteus Minimus Response Twitch response elicited;Palpable increased muscle length   Piriformis Response Twitch response elicited;Palpable increased muscle length              PT Education - 03/25/16 0918    Education provided No          PT Short Term Goals - 03/24/16 0854      PT SHORT TERM GOAL #1   Title The patient will demonstrate  knowledge of basic self care strategies including use of lumbar roll, body mechanics with household chores  04/06/16   Status Achieved     PT SHORT TERM GOAL #2   Title The patient will report a 30% improvement in morning and night pain.     Time 4   Period Weeks   Status On-going     PT SHORT TERM GOAL #3   Title The patient will have centralized symptoms 50% of the time   Time 4   Period Weeks   Status On-going           PT Long Term Goals - 03/16/16 1710      PT LONG TERM GOAL #1   Title The patient will be independent in safe self progression of HEP needed for further improvement in strength and function   05/04/16   Time 8   Period Weeks   Status On-going     PT LONG TERM GOAL #2   Title The patient will report a 60% improvement in AM and night time pain   Time 8   Period Weeks   Status On-going     PT LONG TERM GOAL #3   Title The patient will have improved core/trunk strength to 4/5 needed for lifting and housework   Time 8   Period Weeks   Status On-going      PT LONG TERM GOAL #4   Title Patient will have neural mobility/hamstring muscle length to allow 60 degrees with SLR needed for improved mobilty with decreased leg pain with ADLs   Time 8   Period Weeks   Status On-going     PT LONG TERM GOAL #5   Title FOTO functional outcome score improved form 46% limitation to 27% limitation indicating improved function with less pain   Time 8   Period Weeks   Status On-going               Plan - 03/25/16 0919    Clinical Impression Statement Patient reports 50% less pain since yesterday and is less intense.  Patient has tightness around the left ishcial tuberosity.  Patient responded well to the dry needling last visit and lumbar traction. Patient  will benefit from physical therapy  for strength, flexibility and traction/manual as needed.    Clinical Impairments Affecting Rehab Potential history of LBP 4 years ago after birth of daughter, had PT;  some on/off pain but nothing with this severity.     PT Frequency 2x / week   PT Duration 8 weeks   PT Treatment/Interventions ADLs/Self Care Home Management;Cryotherapy;Electrical Stimulation;Moist Heat;Ultrasound;Traction;Therapeutic exercise;Neuromuscular re-education;Patient/family education;Manual techniques;Dry needling;Taping   PT Next Visit Plan  DN and traction, modalities, gentle flexibility, core strength   PT Home Exercise Plan progress as needed   Recommended Other Services None   Consulted and Agree with Plan of Care Patient      Patient will benefit from skilled therapeutic intervention in order to improve the following deficits and impairments:  Pain, Decreased strength, Postural dysfunction, Decreased range of motion, Improper body mechanics  Visit Diagnosis: Sciatica, left side  Muscle weakness (generalized)     Problem List Patient Active Problem List   Diagnosis Date Noted  . Anal itching 11/29/2012  . External hemorrhoid 11/29/2012  . Internal hemorrhoid  11/29/2012  . Low back pain 12/17/2011  . postaprtum care following VBAC (2/10) 09/05/2011  . Previous cesarean delivery affecting pregnancy 09/04/2011    Earlie Counts, PT 03/25/16 9:23 AM  [facility] [address] [place], [place], [address] Phone: [phone]   Fax:  [phone]  Name: Anna Tran MRN: [id number] Date of Birth: 02/17/1976

## 2016-03-30 ENCOUNTER — Encounter: Payer: BC Managed Care – PPO | Admitting: Physical Therapy

## 2016-03-31 ENCOUNTER — Ambulatory Visit: Payer: BC Managed Care – PPO | Attending: Family Medicine

## 2016-03-31 DIAGNOSIS — M5432 Sciatica, left side: Secondary | ICD-10-CM | POA: Diagnosis not present

## 2016-03-31 DIAGNOSIS — M6281 Muscle weakness (generalized): Secondary | ICD-10-CM | POA: Diagnosis present

## 2016-03-31 NOTE — Patient Instructions (Addendum)
Lower abdominal/core stability exercises  1. Practice your breathing technique: Inhale through your nose expanding your belly and rib cage. Try not to breathe into your chest. Exhale slowly and gradually out your mouth feeling a sense of softness to your body. Practice multiple times. This can be performed unlimited.  2. Finding the lower abdominals. Laying on your back with the knees bent, place your fingers just below your belly button. Using your breathing technique from above, on your exhale gently pull the belly button away from your fingertips without tensing any other muscles. Practice this 5x. Next, as you exhale, draw belly button inwards and hold onto it...then feel as if you are pulling that muscle across your pelvis like you are tightening a belt. This can be hard to do at first so be patient and practice. Do 5-10 reps 1-3 x day. Always recognize quality over quantity; if your abdominal muscles become tired you will notice you may tighten/contract other muscles. This is the time to take a break.   Practice this first laying on your back, then in sitting, progressing to standing and finally adding it to all your daily movements.   3. Finding your pelvic floor. Using the breathing technique above, when your exhale, this time draw your pelvic floor muscles up as if you were attempting to stop the flow of urination. Be careful NOT to tense any other muscles. This can be hard, BE PATIENT. Try to hold up to 10 seconds repeating 10x. Try 2x a day. Once you feel you are doing this well, add this contraction to exercise #2. First contracting your pelvic floor followed by lower abdominals.  4. Adding leg movements. Add the following leg movements to challenge your ability to keep your core stable:  1. Single leg drop outs: Laying on your back with knees bent feet flat. Inhale,  dropping one knee outward KEEPING YOUR PELVIS STILL. Exhale as you bring the leg back, simultaneously performing your lower  abdominal contraction. Do 5-10 on each leg.   [place] [address] [place], [place] [address] Phone # [phone] Fax [phone]

## 2016-03-31 NOTE — Therapy (Signed)
[facility] [address], [place], [place], [address] Phone: [phone]   Fax:  [phone]  Physical Therapy Treatment  Patient Details  Name: Anna Tran MRN: [id number] Date of Birth: 1976-03-20 Referring Provider: Barbaraann Barthel  Encounter Date: 03/31/2016      PT End of Session - 03/31/16 0926    Visit Number 7   Date for PT Re-Evaluation 05/04/16   PT Start Time 0847   PT Stop Time 0940   PT Time Calculation (min) 53 min   Activity Tolerance Patient tolerated treatment well   Behavior During Therapy [facility] for tasks assessed/performed      Past Medical History:  Diagnosis Date  . Acid reflux   . Chicken pox   . Hemorrhoids   . Hyperlipidemia   . Tonsillitis     Past Surgical History:  Procedure Laterality Date  . CESAREAN SECTION     FTP  . CYSTECTOMY     cyst removed from groin  . WISDOM TOOTH EXTRACTION      There were no vitals filed for this visit.      Subjective Assessment - 03/31/16 0848    Subjective Pt reports that things are about the same.     Pertinent History Had PT after had last child 4 years ago; time to time pain   Currently in Pain? Yes   Pain Score 2    Pain Location Leg   Pain Orientation Left   Pain Descriptors / Indicators Burning;Dull;Aching   Pain Type Chronic pain   Pain Onset 1 to 4 weeks ago   Pain Frequency Intermittent   Aggravating Factors  sleep, bending over   Pain Relieving Factors moving around, Aleve 2x/day                         [facility] Adult PT Treatment/Exercise - 03/31/16 0001      Lumbar Exercises: Supine   Ab Set 10 reps   AB Set Limitations Level 1 core strength     Traction   Type of Traction Lumbar   Min (lbs) 30   Max (lbs) 75   Hold Time 60   Rest Time 10   Time 15     Manual Therapy   Manual Therapy Soft tissue mobilization   Manual therapy comments soft tissue to Lt proximal hamstrings          Trigger Point  Dry Needling - 03/31/16 0906    Consent Given? Yes   Muscles Treated Lower Body Hamstring  Lt only   Hamstring Response Twitch response elicited;Palpable increased muscle length              PT Education - 03/31/16 0858    Education provided Yes   Education Details level 1 TA exercises   Person(s) Educated Patient   Methods Explanation;Demonstration;Handout   Comprehension Verbalized understanding;Returned demonstration          PT Short Term Goals - 03/31/16 0927      PT SHORT TERM GOAL #2   Title The patient will report a 30% improvement in morning and night pain.     Status Achieved     PT SHORT TERM GOAL #3   Title The patient will have centralized symptoms 50% of the time   Time 4   Period Weeks   Status On-going           PT Long Term Goals - 03/16/16 1710  PT LONG TERM GOAL #1   Title The patient will be independent in safe self progression of HEP needed for further improvement in strength and function   05/04/16   Time 8   Period Weeks   Status On-going     PT LONG TERM GOAL #2   Title The patient will report a 60% improvement in AM and night time pain   Time 8   Period Weeks   Status On-going     PT LONG TERM GOAL #3   Title The patient will have improved core/trunk strength to 4/5 needed for lifting and housework   Time 8   Period Weeks   Status On-going     PT LONG TERM GOAL #4   Title Patient will have neural mobility/hamstring muscle length to allow 60 degrees with SLR needed for improved mobilty with decreased leg pain with ADLs   Time 8   Period Weeks   Status On-going     PT LONG TERM GOAL #5   Title FOTO functional outcome score improved form 46% limitation to 27% limitation indicating improved function with less pain   Time 8   Period Weeks   Status On-going               Plan - 03/31/16 0902    Clinical Impression Statement Pt reports that she feels 30% better overall.  Pt reports that her Lt leg pain has been  unchanged over the past week and rates this 3/10. PT issued pelvic stabilization exercise today.  Pt with continued trigger points in the Lt hamstrings and demostrates improved mobility after needling.  Pt will continue to benefit from skilled PT for core strength, traction and dry needling if helpful.     Rehab Potential Good   Clinical Impairments Affecting Rehab Potential history of LBP 4 years ago after birth of daughter, had PT;  some on/off pain but nothing with this severity.     PT Frequency 2x / week   PT Duration 8 weeks   PT Treatment/Interventions ADLs/Self Care Home Management;Cryotherapy;Electrical Stimulation;Moist Heat;Ultrasound;Traction;Therapeutic exercise;Neuromuscular re-education;Patient/family education;Manual techniques;Dry needling;Taping   PT Next Visit Plan  DN and traction, modalities, gentle flexibility, core strength   Consulted and Agree with Plan of Care Patient      Patient will benefit from skilled therapeutic intervention in order to improve the following deficits and impairments:  Pain, Decreased strength, Postural dysfunction, Decreased range of motion, Improper body mechanics  Visit Diagnosis: Sciatica, left side  Muscle weakness (generalized)     Problem List Patient Active Problem List   Diagnosis Date Noted  . Anal itching 11/29/2012  . External hemorrhoid 11/29/2012  . Internal hemorrhoid 11/29/2012  . Low back pain 12/17/2011  . postaprtum care following VBAC (2/10) 09/05/2011  . Previous cesarean delivery affecting pregnancy 09/04/2011     Sigurd Sos, PT 03/31/16 9:28 AM  [facility] [address], [place], [place], [address] Phone: [phone]   Fax:  [phone]  Name: VADIE PRINCIPATO MRN: [id number] Date of Birth: 1975/07/29

## 2016-04-01 ENCOUNTER — Encounter: Payer: BC Managed Care – PPO | Admitting: Physical Therapy

## 2016-04-06 ENCOUNTER — Encounter: Payer: Self-pay | Admitting: Family Medicine

## 2016-04-06 ENCOUNTER — Encounter: Payer: BC Managed Care – PPO | Admitting: Physical Therapy

## 2016-04-08 ENCOUNTER — Ambulatory Visit: Payer: BC Managed Care – PPO | Admitting: Physical Therapy

## 2016-04-08 ENCOUNTER — Ambulatory Visit (INDEPENDENT_AMBULATORY_CARE_PROVIDER_SITE_OTHER): Payer: BC Managed Care – PPO | Admitting: Family Medicine

## 2016-04-08 ENCOUNTER — Encounter: Payer: Self-pay | Admitting: Family Medicine

## 2016-04-08 DIAGNOSIS — M5432 Sciatica, left side: Secondary | ICD-10-CM | POA: Diagnosis not present

## 2016-04-08 DIAGNOSIS — M5442 Lumbago with sciatica, left side: Secondary | ICD-10-CM | POA: Diagnosis not present

## 2016-04-08 DIAGNOSIS — M6281 Muscle weakness (generalized): Secondary | ICD-10-CM

## 2016-04-08 MED ORDER — HYDROCODONE-ACETAMINOPHEN 5-325 MG PO TABS: 1.0000 | ORAL_TABLET | Freq: Four times a day (QID) | ORAL | 0 refills | Status: DC | PRN

## 2016-04-08 MED ORDER — METHOCARBAMOL 500 MG PO TABS: 500.0000 mg | ORAL_TABLET | Freq: Three times a day (TID) | ORAL | 1 refills | Status: DC | PRN

## 2016-04-08 MED ORDER — MELOXICAM 15 MG PO TABS: 15.0000 mg | ORAL_TABLET | Freq: Every day | ORAL | 2 refills | Status: DC

## 2016-04-08 NOTE — Therapy (Addendum)
[facility] [address], [place], [place], [address] Phone: [phone]   Fax:  [phone]  Physical Therapy Treatment/Discharge Summary  Patient Details  Name: Anna Tran MRN: [id number] Date of Birth: 02-Jun-1976 Referring Provider: Barbaraann Barthel  Encounter Date: 04/08/2016      PT End of Session - 04/08/16 1000    Visit Number 8   Date for PT Re-Evaluation 05/04/16   PT Start Time 5176   PT Stop Time 0929   PT Time Calculation (min) 42 min   Activity Tolerance Patient limited by pain      Past Medical History:  Diagnosis Date  . Acid reflux   . Chicken pox   . Hemorrhoids   . Hyperlipidemia   . Tonsillitis     Past Surgical History:  Procedure Laterality Date  . CESAREAN SECTION     FTP  . CYSTECTOMY     cyst removed from groin  . WISDOM TOOTH EXTRACTION      There were no vitals filed for this visit.      Subjective Assessment - 04/08/16 0851    Subjective Things had been going better until today.  Woke up with left buttock and posterior left thigh pain.  Hurt to turn left when driving and going up and down steps, putting on socks/shoes this AM.  Prior to this,  she felt time had improved symptoms.  New hamstring region and heel cord region discomfort.  No more calf pain.  Overall symptoms are intermittent vs. constant now.  No longer taking medication.    Going for follow up with the doctor after.  No changes with traction, dry needling   Diagnostic tests MRI L5-S1 small to medium disc   Currently in Pain? Yes   Pain Score 8    Pain Location Buttocks   Pain Orientation Left   Pain Type Chronic pain   Pain Frequency Intermittent   Aggravating Factors  e-stim?  bending over?   Pain Relieving Factors Salonpas, heat;  deep massage; walking, standing                         [facility] Adult PT Treatment/Exercise - 04/08/16 0001      Lumbar Exercises: Prone   Other Prone Lumbar  Exercises prone lying 3 min   Other Prone Lumbar Exercises prone on elbows 30 sec; 5 x prone press ups; with leg in roadkill 5x;       Long discussion of symptoms, walking program, time of day effects and response to therapy.             PT Short Term Goals - 04/08/16 1008      PT SHORT TERM GOAL #1   Title The patient will demonstrate knowledge of basic self care strategies including use of lumbar roll, body mechanics with household chores  04/06/16   Status Achieved     PT SHORT TERM GOAL #2   Title The patient will report a 30% improvement in morning and night pain.     Status Achieved     PT SHORT TERM GOAL #3   Title The patient will have centralized symptoms 50% of the time   Time 4   Period Weeks   Status On-going     PT SHORT TERM GOAL #4   Title Improved lumbar flexion to 45 degrees and lumbar extension to 25 degrees needed for household chores   Time 4   Period  Weeks   Status On-going           PT Long Term Goals - 04/08/16 1009      PT LONG TERM GOAL #1   Title The patient will be independent in safe self progression of HEP needed for further improvement in strength and function   05/04/16   Time 8   Period Weeks   Status On-going     PT LONG TERM GOAL #2   Title The patient will report a 60% improvement in AM and night time pain   Time 8   Period Weeks   Status On-going     PT LONG TERM GOAL #3   Title The patient will have improved core/trunk strength to 4/5 needed for lifting and housework   Time 8   Period Weeks   Status On-going     PT LONG TERM GOAL #4   Title Patient will have neural mobility/hamstring muscle length to allow 60 degrees with SLR needed for improved mobilty with decreased leg pain with ADLs   Time 8   Period Weeks   Status On-going     PT LONG TERM GOAL #5   Title FOTO functional outcome score improved form 46% limitation to 27% limitation indicating improved function with less pain   Time 8   Period Weeks    Status On-going               Plan - 04/08/16 1001    Clinical Impression Statement The patient reports she was feeling better until this morning and she currently complains of increased left buttock and posterior thigh pain for no apparent reason 8/10 intensity.  Performed movement testing including extension with various leg and lateral components with decreased pain intensity to 6/10 but unable to fully centralize symptoms.  She has had multiple treatment interventions including mechanical traction, neutral spine stablization, dry needling, manual techniques and modalities with minimal benefit.  Patient to follow up with MD regarding any further medical interventions that may facilitate her progression with PT.   PT Next Visit Plan see how MD appt went;  traction; core strength;  extension?       PHYSICAL THERAPY DISCHARGE SUMMARY  Visits from Start of Care: 8  Current functional level related to goals / functional outcomes: The patient called back after her MD appt to cancel her remaining appointments, stating that her doctor wanted her to hold PT and to take pain pills and muscle relaxers.  Will discharge her from PT at this time but will be happy to restart if indicated with a new PT order.     Remaining deficits: Minimal progress toward goals, see clinical impressions above   Education / Equipment: Basic HEP and self management strategies Plan: Patient agrees to discharge.  Patient goals were not met. Patient is being discharged due to the physician's request.  ?????        Patient will benefit from skilled therapeutic intervention in order to improve the following deficits and impairments:     Visit Diagnosis: Sciatica, left side  Muscle weakness (generalized)     Problem List Patient Active Problem List   Diagnosis Date Noted  . Anal itching 11/29/2012  . External hemorrhoid 11/29/2012  . Internal hemorrhoid 11/29/2012  . Low back pain 12/17/2011  .  postaprtum care following VBAC (2/10) 09/05/2011  . Previous cesarean delivery affecting pregnancy 09/04/2011    Ruben Im, PT 04/08/16 10:13 AM Phone: [phone] Fax: [phone]  Alvera Singh 04/08/2016,  10:13 AM  [facility] [address], [place], [place], [address] Phone: [phone]   Fax:  [phone]  Name: LECHELLE WRIGLEY MRN: [id number] Date of Birth: Dec 24, 1975

## 2016-04-08 NOTE — Patient Instructions (Signed)
You have lumbar radiculopathy (a pinched nerve in your low back). Ok to take tylenol for baseline pain relief (1-2 extra strength tabs 3x/day) Meloxicam 67m daily with food for pain and inflammation. Consider robaxin up to three times a day for spasms. Consider norco as needed for severe pain. Ok to put the physical therapy on hold for now. Continue your home exercises 3-4 times a week though. Let me know how you're doing next week. We can consider the extended prednisone pack, an epidural steroid injection, the nerve-blocking medications if you're not improving.

## 2016-04-12 NOTE — Assessment & Plan Note (Signed)
history, exam, MRI consistent with S1 radiculopathy.  S/p prednisone, doing physical therapy and home exercises.  Meloxicam daily.  Will hold the physical therapy for now.  She will wait on any further treatment as she had been doing better until past day - will call us to consider ESI, extended prednisone, nerve blocking medications if not improving.

## 2016-04-12 NOTE — Progress Notes (Signed)
PCP: KNAPP,EVE A, MD  Subjective:   HPI: Patient is a 40 y.o. female here for low back pain.  8/2: Patient reports she's had worsening pain left side of low back for 2 weeks. Radiates down left leg to the calf. Tried ibuprofen without much benefit. Felt like a bad charlie horse in leg and also recalls doing a new workout but no acute injury. Pain level is 5/10, sharp. No numbness or tingling. Having difficulty sleeping. Has been stretching. No bowel/bladder dysfunction.  9/14: Patient reports she was feeling better until this morning. Woke up in a lot of pain at 6/10 level. Sharp. Worse bending forward. No numbness or tingling. Pain radiates posteriorly from left buttocks to knee. Worse with sitting as well. No bowel/bladder dysfunction.  Past Medical History:  Diagnosis Date  . Acid reflux   . Chicken pox   . Hemorrhoids   . Hyperlipidemia   . Tonsillitis     Current Outpatient Prescriptions on File Prior to Visit  Medication Sig Dispense Refill  . doxycycline (VIBRAMYCIN) 100 MG capsule      No current facility-administered medications on file prior to visit.     Past Surgical History:  Procedure Laterality Date  . CESAREAN SECTION     FTP  . CYSTECTOMY     cyst removed from groin  . WISDOM TOOTH EXTRACTION      Allergies  Allergen Reactions  . Amoxicillin Rash  . Sulfa Antibiotics Rash    Social History   Social History  . Marital status: Married    Spouse name: N/A  . Number of children: N/A  . Years of education: N/A   Occupational History  . 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 Smoking status: Never Smoker  . Smokeless tobacco: Never Used  . Alcohol use Yes     Comment: 2 glasses of wine every couple of months.   . Drug use: No  . Sexual activity: Yes    Partners: Male    Birth control/ protection: None     Comment: husband with vasectomy   Other Topics Concern  . Not on file   Social History  Narrative   Married, lives with husband, 2 children (girl/boy), no pets.   Danielle Johndrow's sister-in-law    Family History  Problem Relation Age of Onset  . Hyperlipidemia Mother   . Depression Mother   . Hyperlipidemia Father   . Asthma Father   . Hypertension Father   . Cancer Paternal Grandmother     skin cancer  . Anesthesia problems Neg Hx   . Diabetes Neg Hx   . Heart attack Neg Hx   . Sudden death Neg Hx   . Colon cancer Neg Hx   . Breast cancer Neg Hx     BP 126/85   Pulse 98   Ht 5' 5"  (1.651 m)   Wt 140 lb (63.5 kg)   BMI 23.30 kg/m   Review of Systems: See HPI above.    Objective:  Physical Exam:  Gen: NAD, comfortable in exam room  Back: No gross deformity, scoliosis. TTP left lumbar paraspinal region.  No midline or bony TTP. FROM with pain on flexion > extension. Strength LEs 5/5 all muscle groups.   2+ MSRs in patellar and achilles tendons, equal bilaterally. Negative SLRs. Sensation intact to light touch bilaterally. Negative logroll bilateral hips Negative fabers and piriformis stretches.    Assessment & Plan:  1. Lumbar radiculopathy - history,  exam, MRI consistent with S1 radiculopathy.  S/p prednisone, doing physical therapy and home exercises.  Meloxicam daily.  Will hold the physical therapy for now.  She will wait on any further treatment as she had been doing better until past day - will call us to consider ESI, extended prednisone, nerve blocking medications if not improving.

## 2016-04-13 ENCOUNTER — Encounter: Payer: BC Managed Care – PPO | Admitting: Physical Therapy

## 2016-04-15 ENCOUNTER — Encounter: Payer: BC Managed Care – PPO | Admitting: Physical Therapy

## 2016-04-20 ENCOUNTER — Encounter: Payer: BC Managed Care – PPO | Admitting: Physical Therapy

## 2016-04-22 ENCOUNTER — Encounter: Payer: BC Managed Care – PPO | Admitting: Physical Therapy

## 2016-04-27 ENCOUNTER — Encounter: Payer: BC Managed Care – PPO | Admitting: Physical Therapy

## 2016-04-29 ENCOUNTER — Encounter: Payer: BC Managed Care – PPO | Admitting: Physical Therapy

## 2016-04-29 ENCOUNTER — Ambulatory Visit: Payer: BC Managed Care – PPO | Admitting: Physical Therapy

## 2016-05-03 ENCOUNTER — Encounter (HOSPITAL_COMMUNITY): Payer: Self-pay

## 2016-05-03 ENCOUNTER — Emergency Department (HOSPITAL_COMMUNITY)
Admission: EM | Admit: 2016-05-03 | Discharge: 2016-05-03 | Disposition: A | Discharge status: Home / Self Care | Payer: BC Managed Care – PPO | Attending: Emergency Medicine | Admitting: Emergency Medicine

## 2016-05-03 DIAGNOSIS — M5106 Intervertebral disc disorders with myelopathy, lumbar region: Secondary | ICD-10-CM | POA: Diagnosis not present

## 2016-05-03 DIAGNOSIS — M545 Low back pain: Secondary | ICD-10-CM | POA: Diagnosis present

## 2016-05-03 DIAGNOSIS — M5126 Other intervertebral disc displacement, lumbar region: Secondary | ICD-10-CM

## 2016-05-03 LAB — CBC WITH DIFFERENTIAL/PLATELET
Basophils Absolute: 0 K/uL (ref 0.0–0.1)
Basophils Relative: 0 %
Eosinophils Absolute: 0.2 K/uL (ref 0.0–0.7)
Eosinophils Relative: 4 %
HCT: 40.8 % (ref 36.0–46.0)
Hemoglobin: 13.2 g/dL (ref 12.0–15.0)
Lymphocytes Relative: 22 %
Lymphs Abs: 1.3 K/uL (ref 0.7–4.0)
MCH: 28.5 pg (ref 26.0–34.0)
MCHC: 32.4 g/dL (ref 30.0–36.0)
MCV: 88.1 fL (ref 78.0–100.0)
Monocytes Absolute: 0.4 K/uL (ref 0.1–1.0)
Monocytes Relative: 6 %
Neutro Abs: 4 K/uL (ref 1.7–7.7)
Neutrophils Relative %: 68 %
Platelets: 267 K/uL (ref 150–400)
RBC: 4.63 MIL/uL (ref 3.87–5.11)
RDW: 12.1 % (ref 11.5–15.5)
WBC: 5.9 K/uL (ref 4.0–10.5)

## 2016-05-03 LAB — BASIC METABOLIC PANEL WITH GFR
Anion gap: 8 (ref 5–15)
BUN: 8 mg/dL (ref 6–20)
CO2: 25 mmol/L (ref 22–32)
Calcium: 8.6 mg/dL — ABNORMAL LOW (ref 8.9–10.3)
Chloride: 105 mmol/L (ref 101–111)
Creatinine, Ser: 0.63 mg/dL (ref 0.44–1.00)
GFR calc Af Amer: >60 mL/min
GFR calc non Af Amer: >60 mL/min
Glucose, Bld: 90 mg/dL (ref 65–99)
Potassium: 3.8 mmol/L (ref 3.5–5.1)
Sodium: 138 mmol/L (ref 135–145)

## 2016-05-03 MED ORDER — KETOROLAC TROMETHAMINE 30 MG/ML IJ SOLN
30.0000 mg | Freq: Once | INTRAMUSCULAR | Status: AC
  Administered 2016-05-03: 30 mg via INTRAVENOUS
  Filled 2016-05-03: qty 1

## 2016-05-03 MED ORDER — HYDROMORPHONE HCL 1 MG/ML IJ SOLN
1.0000 mg | Freq: Once | INTRAMUSCULAR | Status: AC
  Administered 2016-05-03: 1 mg via INTRAVENOUS
  Filled 2016-05-03: qty 1

## 2016-05-03 MED ORDER — GABAPENTIN 300 MG PO CAPS: 300.0000 mg | ORAL_CAPSULE | Freq: Three times a day (TID) | ORAL | 0 refills | Status: DC

## 2016-05-03 MED ORDER — OXYCODONE-ACETAMINOPHEN 5-325 MG PO TABS
1.0000 | ORAL_TABLET | Freq: Once | ORAL | Status: AC
  Administered 2016-05-03: 1 via ORAL
  Filled 2016-05-03: qty 1

## 2016-05-03 NOTE — ED Triage Notes (Signed)
Patient here with known L5 herniated disc and has intermittent flare ups with sciatica. Developed left buttock pain to left leg again Saturday, no known injury, no neuro deficits. Has taken vicodin 0915, mobic this am and muscle relaxer 2pm

## 2016-05-03 NOTE — Consult Note (Signed)
Reason for Consult: Left Leg pain Referring Physician: Dr. Georgetta Haber is an 40 y.o. female.  HPI: The patient is a 40 year old white female who was in her usual state of good health until approximately 3 months ago, i.e. mid July. It was at that time she began having pain in her left leg. She does not recall any specific precipitating events. The patient was seen by her primary doctor, Dr.  Glynis Smiles, and treated with medications including prednisone, meloxicam, hydrocodone, etc. Unfortunately her symptoms continued. She was worked up with a lumbar MRI which demonstrated a herniated disc. She was treated with physical therapy which did not help. The patient's pain has become severe and she came to the [facility] ER. She was treated with medication including Dilaudid. A neurosurgical consultation has been requested.  Presently the patient is pleasant. She complains of pain in her left buttock and down her left leg and S1 distribution. She has mild numbness. She doesn't have any right-sided radicular symptoms.  Past Medical History:  Diagnosis Date  . Acid reflux   . Chicken pox   . Hemorrhoids   . Hyperlipidemia   . Tonsillitis     Past Surgical History:  Procedure Laterality Date  . CESAREAN SECTION     FTP  . CYSTECTOMY     cyst removed from groin  . WISDOM TOOTH EXTRACTION      Family History  Problem Relation Age of Onset  . Hyperlipidemia Mother   . Depression Mother   . Hyperlipidemia Father   . Asthma Father   . Hypertension Father   . Cancer Paternal Grandmother     skin cancer  . Anesthesia problems Neg Hx   . Diabetes Neg Hx   . Heart attack Neg Hx   . Sudden death Neg Hx   . Colon cancer Neg Hx   . Breast cancer Neg Hx     Social History:  reports that she has never smoked. She has never used smokeless tobacco. She reports that she drinks alcohol. She reports that she does not use drugs.  Allergies:  Allergies  Allergen Reactions  . Amoxicillin Rash   . Sulfa Antibiotics Rash    Medications:  I have reviewed the patient's current medications. Prior to Admission:  (Not in a hospital admission) Scheduled: Continuous: PRN: Anti-infectives    None       Results for orders placed or performed during the hospital encounter of 05/03/16 (from the past 48 hour(s))  CBC with Differential     Status: None   Collection Time: 05/03/16  4:18 PM  Result Value Ref Range   WBC 5.9 4.0 - 10.5 K/uL   RBC 4.63 3.87 - 5.11 MIL/uL   Hemoglobin 13.2 12.0 - 15.0 g/dL   HCT 40.8 36.0 - 46.0 %   MCV 88.1 78.0 - 100.0 fL   MCH 28.5 26.0 - 34.0 pg   MCHC 32.4 30.0 - 36.0 g/dL   RDW 12.1 11.5 - 15.5 %   Platelets 267 150 - 400 K/uL   Neutrophils Relative % 68 %   Neutro Abs 4.0 1.7 - 7.7 K/uL   Lymphocytes Relative 22 %   Lymphs Abs 1.3 0.7 - 4.0 K/uL   Monocytes Relative 6 %   Monocytes Absolute 0.4 0.1 - 1.0 K/uL   Eosinophils Relative 4 %   Eosinophils Absolute 0.2 0.0 - 0.7 K/uL   Basophils Relative 0 %   Basophils Absolute 0.0 0.0 - 0.1 K/uL  Basic metabolic panel     Status: Abnormal   Collection Time: 05/03/16  4:18 PM  Result Value Ref Range   Sodium 138 135 - 145 mmol/L   Potassium 3.8 3.5 - 5.1 mmol/L   Chloride 105 101 - 111 mmol/L   CO2 25 22 - 32 mmol/L   Glucose, Bld 90 65 - 99 mg/dL   BUN 8 6 - 20 mg/dL   Creatinine, Ser 0.63 0.44 - 1.00 mg/dL   Calcium 8.6 (L) 8.9 - 10.3 mg/dL   GFR calc non Af Amer >60 >60 mL/min   GFR calc Af Amer >60 >60 mL/min    Comment: (NOTE) The eGFR has been calculated using the CKD EPI equation. This calculation has not been validated in all clinical situations. eGFR's persistently <60 mL/min signify possible Chronic Kidney Disease.    Anion gap 8 5 - 15    No results found.  ROS: As above Blood pressure 150/94, pulse 118, temperature 98.3 F (36.8 C), temperature source Oral, resp. rate 17, height 5' 5"  (1.651 m), weight 65.8 kg (145 lb), SpO2 100 %. Physical Exam  General: An  alert and pleasant 40 year old white female complains of left leg pain.  HEENT: Normocephalic, atraumatic, pupils equal round reactive light, extraocular muscles intact,  Thorax: Symmetric  Abdomen: Soft  Extremities: Unremarkable  Neurologic exam: The patient is alert and oriented 3. Cranial nerves II through XII were examined bilaterally and grossly normal. Vision and hearing are grossly normal bilaterally. Her motor strength is 5 over 5 in her bilateral biceps, triceps, hand grip, quadriceps, right extensor hallucis longus and gastrocnemius. She has some slight weakness in the left gastrocnemius extensor longus at 4+ over 5. Sensory exam is grossly normal to light touch sensation all tested dermatomes bilaterally. Her deep tendon reflexes are 2 over 4 in the right gastrocnemius M1 to 204 and her left gastrocnemius.  Back exam: The patient has a positive straight leg raise test on the left. Faber's testing is negative.  Imaging studies: I reviewed the patient's lumbar MRI performed at [facility] on 03/06/2016. The patient has a normal lumbar lordosis. She has some disc degeneration at L5-S1 and L4-5 with disc desiccation. At L4-5 there is a mild central bulge without significant neural compression. She has left-sided herniated disc at L5-S1 with compression of the left S1 and possibly L5 nerve root. The other levels are unremarkable.  Assessment/Plan: Left L5-S1 herniated disc, left lumbar radiculopathy: I discussed the situation with the patient, her husband, and sister-in-law. I reviewed her MRI scan with him and pointed out the abnormalities. Her symptoms seem consistent with a left S1 and/or L5 radiculopathy. She has not improved despite time, physical therapy, steroids, etc. We have discussed the various treatment options including doing nothing, continue medical management, lumbar epidural injections, and surgery. I described the left L5-S1 discectomy. I have discussed the risks of  surgery including infection, spinal fluid leak, nerve injury, recurrent ruptured disc, medical risk, failure to relieve the pain, etc. I have answered all their questions. She is going to think things over and decide what she wants to do. Please have her follow-up with me in the office in about a month. I've instructed her to call me if she wants to schedule anything. I would suggest a trial of Neurontin.  Kristyana Notte D 05/03/2016, 6:27 PM

## 2016-05-03 NOTE — Discharge Instructions (Signed)
Take neurontin as prescribed. Follow up with neurosurgery as instructed. Return to the ED if you experience severe worsening of your symptoms, inability to walk, loss of control of bowels or bladder, fevers, chills.

## 2016-05-03 NOTE — ED Provider Notes (Signed)
[facility] DEPT Provider Note   CSN: [id number] Arrival date & time: 05/03/16  1443     History   Chief Complaint Chief Complaint  Patient presents with  . Back Pain    HPI Anna Tran is a 40 y.o. female with a past medical history of HLD, GERD who presents to the ED today complaining of ongoing back pain. Patient states she has been experiencing left lower back/hip pain that radiates down her left leg since July 2017. No known trauma or injury to the back. Patient states she had an MRI done at that time which revealed a herniated disc. She has been seeing Dr. Barbaraann Barthel for her pain since that time. She has tried taking NSAIDs, Vicodin, muscle relaxers without relief. Patient has also been been doing physical therapy with minimal relief in her symptoms. Patient states her pain is progressively worsening. She is able to ambulate states is painful. She denies any bowel or bladder incontinence but states that straining and sitting is painful.  HPI  Past Medical History:  Diagnosis Date  . Acid reflux   . Chicken pox   . Hemorrhoids   . Hyperlipidemia   . Tonsillitis     Patient Active Problem List   Diagnosis Date Noted  . Anal itching 11/29/2012  . External hemorrhoid 11/29/2012  . Internal hemorrhoid 11/29/2012  . Low back pain 12/17/2011  . postaprtum care following VBAC (2/10) 09/05/2011  . Previous cesarean delivery affecting pregnancy 09/04/2011    Past Surgical History:  Procedure Laterality Date  . CESAREAN SECTION     FTP  . CYSTECTOMY     cyst removed from groin  . WISDOM TOOTH EXTRACTION      OB History    Gravida Para Term Preterm AB Living   2 2 2     2    SAB TAB Ectopic Multiple Live Births           1       Home Medications    Prior to Admission medications   Medication Sig Start Date End Date Taking? Authorizing Provider  HYDROcodone-acetaminophen (NORCO) 5-325 MG tablet Take 1 tablet by mouth every 6 (six) hours as needed for moderate  pain. 04/08/16  Yes Dene Gentry, MD  meloxicam (MOBIC) 15 MG tablet Take 1 tablet (15 mg total) by mouth daily. 04/08/16  Yes Dene Gentry, MD  methocarbamol (ROBAXIN) 500 MG tablet Take 1 tablet (500 mg total) by mouth every 8 (eight) hours as needed for muscle spasms. 04/08/16  Yes Dene Gentry, MD    Family History Family History  Problem Relation Age of Onset  . Hyperlipidemia Mother   . Depression Mother   . Hyperlipidemia Father   . Asthma Father   . Hypertension Father   . Cancer Paternal Grandmother     skin cancer  . Anesthesia problems Neg Hx   . Diabetes Neg Hx   . Heart attack Neg Hx   . Sudden death Neg Hx   . Colon cancer Neg Hx   . Breast cancer Neg Hx     Social History Social History  Substance Use Topics  . Smoking status: Never Smoker  . Smokeless tobacco: Never Used  . Alcohol use Yes     Comment: 2 glasses of wine every couple of months.      Allergies   Amoxicillin and Sulfa antibiotics   Review of Systems Review of Systems  All other systems reviewed and are negative.  Physical Exam Updated Vital Signs BP 150/94 (BP Location: Left Arm)   Pulse 118   Temp 98.3 F (36.8 C) (Oral)   Resp 17   Ht 5' 5"  (1.651 m)   Wt 65.8 kg   SpO2 100%   BMI 24.13 kg/m   Physical Exam  Constitutional: She is oriented to person, place, and time. She appears well-developed and well-nourished. No distress.  HENT:  Head: Normocephalic and atraumatic.  Eyes: Conjunctivae are normal. Right eye exhibits no discharge. Left eye exhibits no discharge. No scleral icterus.  Cardiovascular: Normal rate.   Pulmonary/Chest: Effort normal.  Musculoskeletal:  No midline spinal TTP. FROM of C, T, L spine. No step offs or obvious bony deformities. Negative SLR. Pain with deep palpation of left glute and posterior aspect of left leg.    Neurological: She is alert and oriented to person, place, and time. Coordination normal.  Strength 5/5 throughout. No  sensory deficits. No gait abnormality.  Skin: Skin is warm and dry. No rash noted. She is not diaphoretic. No erythema. No pallor.  Psychiatric: She has a normal mood and affect. Her behavior is normal.  Nursing note and vitals reviewed.    ED Treatments / Results  Labs (all labs ordered are listed, but only abnormal results are displayed) Labs Reviewed  CBC WITH DIFFERENTIAL/PLATELET  BASIC METABOLIC PANEL    EKG  EKG Interpretation None       Radiology No results found.  Procedures Procedures (including critical care time)  Medications Ordered in ED Medications  HYDROmorphone (DILAUDID) injection 1 mg (0 mg Intravenous Hold 05/03/16 1620)  oxyCODONE-acetaminophen (PERCOCET/ROXICET) 5-325 MG per tablet 1 tablet (1 tablet Oral Given 05/03/16 1532)     Initial Impression / Assessment and Plan / ED Course  I have reviewed the triage vital signs and the nursing notes.  Pertinent labs & imaging results that were available during my care of the patient were reviewed by me and considered in my medical decision making (see chart for details).  Clinical Course    Otherwise healthy 40 year old female presents to the ED today complaining of ongoing back pain 3 months. Patient has known lumbar herniated disc after having MRI. Pain is increasing in not relieved with home Vicodin or muscle relaxers. On presentation to ED, patient appears uncomfortable but is able to ambulate. No neurological deficits noted on exam. Pain managed in ED with IV Dilaudid. Dr. [name], neurosurgeon consult patient in ED to discuss risks versus benefits of surgery. Plan to discharge home with prescription for Neurontin per Dr. [name] recommendations. Patient will follow-up with him in the office in approximately 1 month. If she would like to have surgery soon or she may call Dr. [name] office to schedule this. Return precautions outlined in patient discharge instructions.  Final Clinical Impressions(s) /  ED Diagnoses   Final diagnoses:  None    New Prescriptions New Prescriptions   No medications on file     Carlos Levering, PA-C 05/03/16 2156    Julianne Rice, MD 05/09/16 [phone]

## 2016-05-04 ENCOUNTER — Other Ambulatory Visit: Payer: Self-pay | Admitting: Neurosurgery

## 2016-05-05 ENCOUNTER — Encounter (HOSPITAL_COMMUNITY): Payer: Self-pay | Admitting: *Deleted

## 2016-05-06 ENCOUNTER — Ambulatory Visit (HOSPITAL_COMMUNITY): Payer: BC Managed Care – PPO | Admitting: Anesthesiology

## 2016-05-06 ENCOUNTER — Ambulatory Visit (HOSPITAL_COMMUNITY): Payer: BC Managed Care – PPO

## 2016-05-06 ENCOUNTER — Ambulatory Visit (HOSPITAL_COMMUNITY)
Admission: RE | Admit: 2016-05-06 | Discharge: 2016-05-07 | Disposition: A | Discharge status: Home / Self Care | Payer: BC Managed Care – PPO | Source: Ambulatory Visit | Attending: Neurosurgery | Admitting: Neurosurgery

## 2016-05-06 ENCOUNTER — Encounter (HOSPITAL_COMMUNITY): Payer: Self-pay | Admitting: Anesthesiology

## 2016-05-06 ENCOUNTER — Encounter (HOSPITAL_COMMUNITY)
Admission: RE | Disposition: A | Discharge status: Home / Self Care | Payer: Self-pay | Source: Ambulatory Visit | Attending: Neurosurgery

## 2016-05-06 DIAGNOSIS — Z8249 Family history of ischemic heart disease and other diseases of the circulatory system: Secondary | ICD-10-CM | POA: Insufficient documentation

## 2016-05-06 DIAGNOSIS — Z825 Family history of asthma and other chronic lower respiratory diseases: Secondary | ICD-10-CM | POA: Insufficient documentation

## 2016-05-06 DIAGNOSIS — Z808 Family history of malignant neoplasm of other organs or systems: Secondary | ICD-10-CM | POA: Diagnosis not present

## 2016-05-06 DIAGNOSIS — F410 Panic disorder [episodic paroxysmal anxiety] without agoraphobia: Secondary | ICD-10-CM | POA: Diagnosis not present

## 2016-05-06 DIAGNOSIS — M5117 Intervertebral disc disorders with radiculopathy, lumbosacral region: Secondary | ICD-10-CM | POA: Insufficient documentation

## 2016-05-06 DIAGNOSIS — Z882 Allergy status to sulfonamides status: Secondary | ICD-10-CM | POA: Diagnosis not present

## 2016-05-06 DIAGNOSIS — E785 Hyperlipidemia, unspecified: Secondary | ICD-10-CM | POA: Diagnosis not present

## 2016-05-06 DIAGNOSIS — Z880 Allergy status to penicillin: Secondary | ICD-10-CM | POA: Diagnosis not present

## 2016-05-06 DIAGNOSIS — M5126 Other intervertebral disc displacement, lumbar region: Secondary | ICD-10-CM | POA: Diagnosis present

## 2016-05-06 DIAGNOSIS — Z9889 Other specified postprocedural states: Secondary | ICD-10-CM | POA: Diagnosis present

## 2016-05-06 DIAGNOSIS — Z419 Encounter for procedure for purposes other than remedying health state, unspecified: Secondary | ICD-10-CM

## 2016-05-06 HISTORY — DX: Dorsalgia, unspecified: M54.9

## 2016-05-06 HISTORY — DX: Other difficulties with micturition: R39.198

## 2016-05-06 HISTORY — DX: Other chronic pain: G89.29

## 2016-05-06 HISTORY — PX: LUMBAR LAMINECTOMY/DECOMPRESSION MICRODISCECTOMY: SHX5026

## 2016-05-06 HISTORY — DX: Personal history of other mental and behavioral disorders: Z86.59

## 2016-05-06 LAB — SURGICAL PCR SCREEN
MRSA, PCR: NEGATIVE
STAPHYLOCOCCUS AUREUS: NEGATIVE

## 2016-05-06 LAB — HCG, SERUM, QUALITATIVE: Preg, Serum: NEGATIVE

## 2016-05-06 IMAGING — CR DG LUMBAR SPINE 1V
1 series · 1 of 1 positions shown · non-contrast
Comparison: MRI lumbar spine 02/24/2016, radiograph 03/04/2016

CLINICAL DATA: Discectomy

EXAM:
LUMBAR SPINE - 1 VIEW

[xtable lateral]
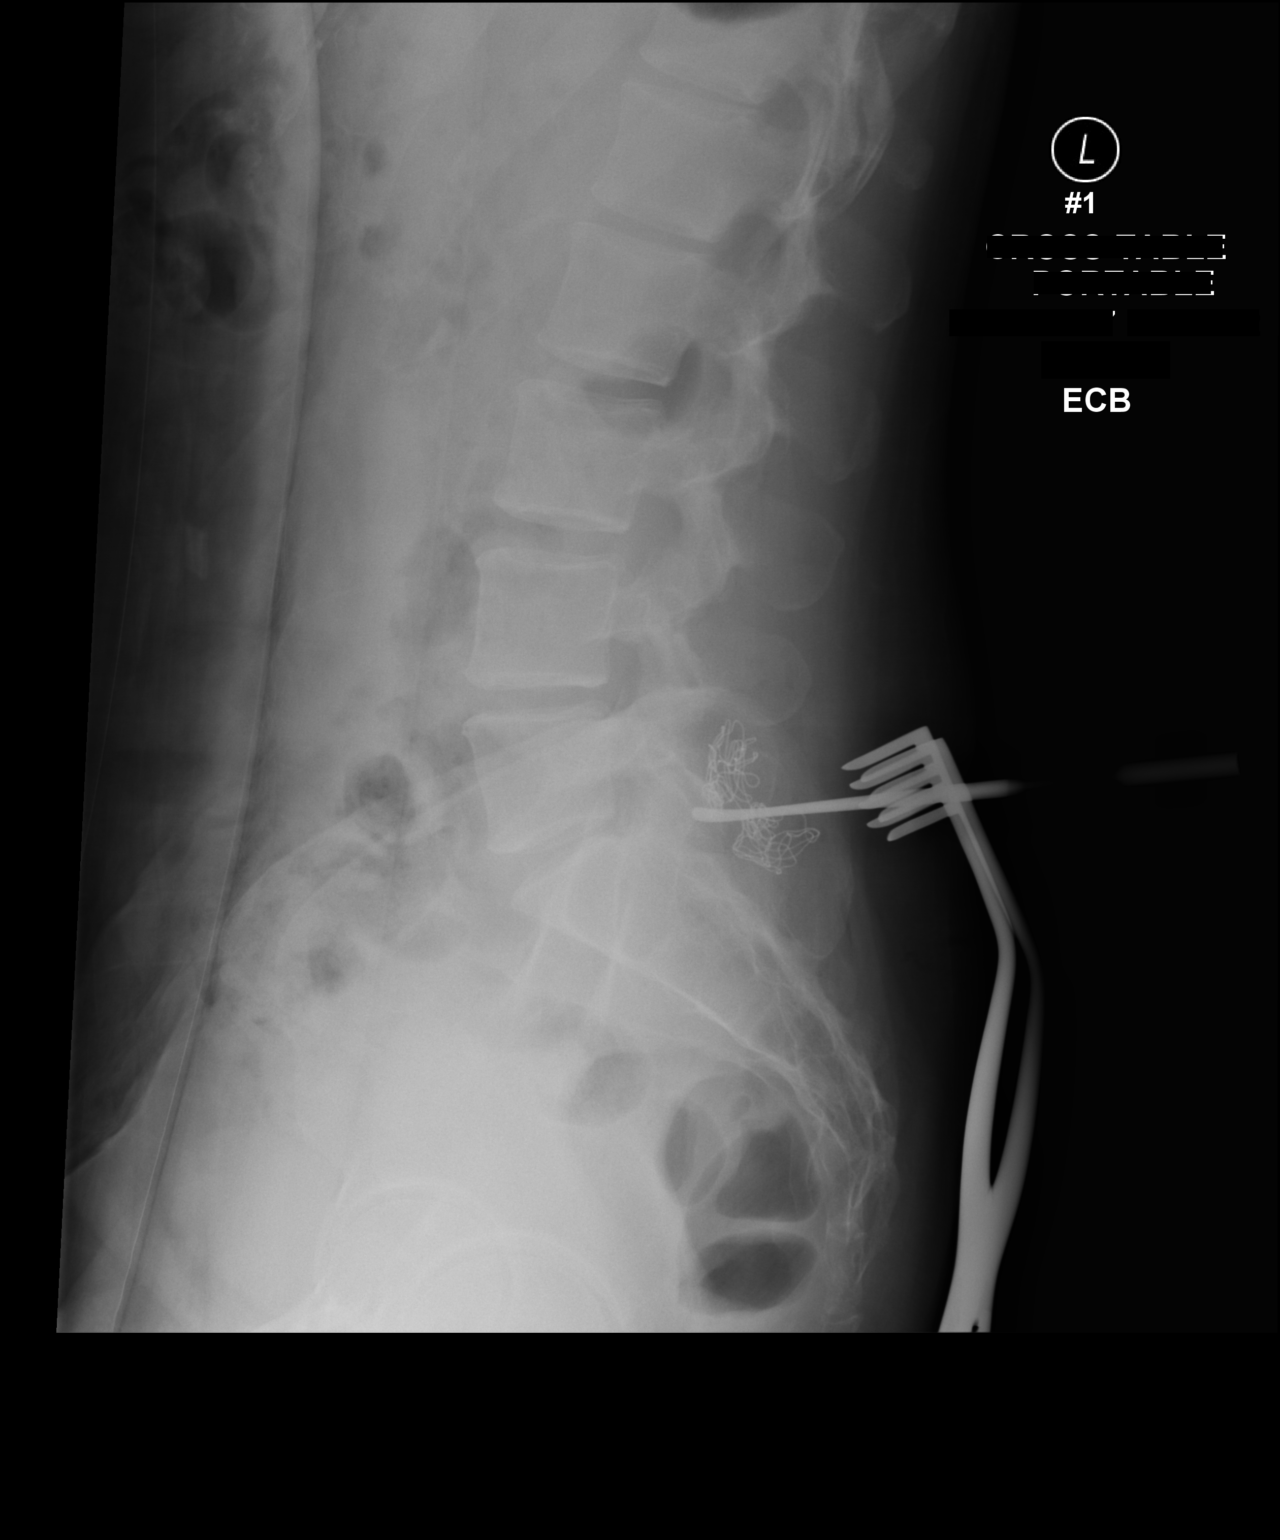

[1 of 1 positions shown; findings below may reference images not displayed]

FINDINGS: Single lateral view of the lumbar spine submitted. The lumbar
alignment is within normal limits. Disc spaces are symmetric.
Surgical devices are present posterior to L5 and upper sacrum with
linear probe at the level of L5-S1.
IMPRESSION: Single lateral view of the lumbar spine obtained for operative
purposes. Metallic probe points to the L5-S1 disc level.

## 2016-05-06 SURGERY — LUMBAR LAMINECTOMY/DECOMPRESSION MICRODISCECTOMY 1 LEVEL
Anesthesia: General | Site: Spine Lumbar | Laterality: Left

## 2016-05-06 MED ORDER — LACTATED RINGERS IV SOLN: INTRAVENOUS | Status: DC

## 2016-05-06 MED ORDER — PHENYLEPHRINE 40 MCG/ML (10ML) SYRINGE FOR IV PUSH (FOR BLOOD PRESSURE SUPPORT)
PREFILLED_SYRINGE | INTRAVENOUS | Status: AC
  Filled 2016-05-06: qty 10

## 2016-05-06 MED ORDER — SODIUM CHLORIDE 0.9 % IR SOLN
Status: DC | PRN
  Administered 2016-05-06: 17:00:00

## 2016-05-06 MED ORDER — SCOPOLAMINE 1 MG/3DAYS TD PT72
1.0000 | MEDICATED_PATCH | TRANSDERMAL | Status: DC
  Administered 2016-05-06: 1.5 mg via TRANSDERMAL

## 2016-05-06 MED ORDER — MORPHINE SULFATE (PF) 2 MG/ML IV SOLN: 1.0000 mg | INTRAVENOUS | Status: DC | PRN

## 2016-05-06 MED ORDER — PROPOFOL 10 MG/ML IV BOLUS
INTRAVENOUS | Status: DC | PRN
  Administered 2016-05-06: 140 mg via INTRAVENOUS

## 2016-05-06 MED ORDER — FENTANYL CITRATE (PF) 100 MCG/2ML IJ SOLN
INTRAMUSCULAR | Status: AC
  Filled 2016-05-06: qty 2

## 2016-05-06 MED ORDER — BUPIVACAINE-EPINEPHRINE (PF) 0.5% -1:200000 IJ SOLN
INTRAMUSCULAR | Status: AC
  Filled 2016-05-06: qty 30

## 2016-05-06 MED ORDER — PHENYLEPHRINE HCL 10 MG/ML IJ SOLN
INTRAMUSCULAR | Status: DC | PRN
  Administered 2016-05-06: 40 ug via INTRAVENOUS
  Administered 2016-05-06: 100 ug via INTRAVENOUS

## 2016-05-06 MED ORDER — SUGAMMADEX SODIUM 200 MG/2ML IV SOLN
INTRAVENOUS | Status: AC
  Filled 2016-05-06: qty 2

## 2016-05-06 MED ORDER — ROCURONIUM BROMIDE 10 MG/ML (PF) SYRINGE
PREFILLED_SYRINGE | INTRAVENOUS | Status: AC
  Filled 2016-05-06: qty 10

## 2016-05-06 MED ORDER — ALUM & MAG HYDROXIDE-SIMETH 200-200-20 MG/5ML PO SUSP: 30.0000 mL | Freq: Four times a day (QID) | ORAL | Status: DC | PRN

## 2016-05-06 MED ORDER — MENTHOL 3 MG MT LOZG: 1.0000 | LOZENGE | OROMUCOSAL | Status: DC | PRN

## 2016-05-06 MED ORDER — DEXAMETHASONE SODIUM PHOSPHATE 10 MG/ML IJ SOLN
INTRAMUSCULAR | Status: DC | PRN
  Administered 2016-05-06: 8 mg via INTRAVENOUS

## 2016-05-06 MED ORDER — HYDROMORPHONE HCL 1 MG/ML IJ SOLN
1.0000 mg | Freq: Once | INTRAMUSCULAR | Status: AC
  Administered 2016-05-06: 1 mg via INTRAVENOUS

## 2016-05-06 MED ORDER — LIDOCAINE 2% (20 MG/ML) 5 ML SYRINGE
INTRAMUSCULAR | Status: AC
  Filled 2016-05-06: qty 5

## 2016-05-06 MED ORDER — SCOPOLAMINE 1 MG/3DAYS TD PT72
MEDICATED_PATCH | TRANSDERMAL | Status: AC
  Administered 2016-05-06: 1.5 mg via TRANSDERMAL
  Filled 2016-05-06: qty 1

## 2016-05-06 MED ORDER — DOCUSATE SODIUM 100 MG PO CAPS
100.0000 mg | ORAL_CAPSULE | Freq: Two times a day (BID) | ORAL | Status: DC
  Administered 2016-05-06: 100 mg via ORAL
  Filled 2016-05-06: qty 1

## 2016-05-06 MED ORDER — HYDROCODONE-ACETAMINOPHEN 5-325 MG PO TABS
1.0000 | ORAL_TABLET | ORAL | Status: DC | PRN
  Administered 2016-05-06: 1 via ORAL
  Filled 2016-05-06: qty 1

## 2016-05-06 MED ORDER — FENTANYL CITRATE (PF) 100 MCG/2ML IJ SOLN
INTRAMUSCULAR | Status: AC
  Filled 2016-05-06: qty 4

## 2016-05-06 MED ORDER — BACITRACIN ZINC 500 UNIT/GM EX OINT
TOPICAL_OINTMENT | CUTANEOUS | Status: DC | PRN
  Administered 2016-05-06: 1 via TOPICAL

## 2016-05-06 MED ORDER — ONDANSETRON HCL 4 MG/2ML IJ SOLN: 4.0000 mg | INTRAMUSCULAR | Status: DC | PRN

## 2016-05-06 MED ORDER — PROPOFOL 10 MG/ML IV BOLUS
INTRAVENOUS | Status: AC
  Filled 2016-05-06: qty 20

## 2016-05-06 MED ORDER — ONDANSETRON HCL 4 MG/2ML IJ SOLN
INTRAMUSCULAR | Status: AC
  Filled 2016-05-06: qty 2

## 2016-05-06 MED ORDER — DIAZEPAM 5 MG PO TABS: 5.0000 mg | ORAL_TABLET | Freq: Four times a day (QID) | ORAL | Status: DC | PRN

## 2016-05-06 MED ORDER — THROMBIN 5000 UNITS EX SOLR
CUTANEOUS | Status: AC
  Filled 2016-05-06: qty 10000

## 2016-05-06 MED ORDER — ROCURONIUM BROMIDE 100 MG/10ML IV SOLN
INTRAVENOUS | Status: DC | PRN
Start: 2016-05-06 — End: 2016-05-06
  Administered 2016-05-06: 50 mg via INTRAVENOUS

## 2016-05-06 MED ORDER — ACETAMINOPHEN 650 MG RE SUPP: 650.0000 mg | RECTAL | Status: DC | PRN

## 2016-05-06 MED ORDER — MUPIROCIN 2 % EX OINT
1.0000 "application " | TOPICAL_OINTMENT | Freq: Once | CUTANEOUS | Status: AC
  Administered 2016-05-06: 1 via TOPICAL

## 2016-05-06 MED ORDER — ACETAMINOPHEN 325 MG PO TABS: 650.0000 mg | ORAL_TABLET | ORAL | Status: DC | PRN

## 2016-05-06 MED ORDER — LACTATED RINGERS IV SOLN
INTRAVENOUS | Status: DC
  Administered 2016-05-06 (×2): via INTRAVENOUS

## 2016-05-06 MED ORDER — GABAPENTIN 300 MG PO CAPS
300.0000 mg | ORAL_CAPSULE | Freq: Three times a day (TID) | ORAL | Status: DC
  Administered 2016-05-06: 300 mg via ORAL
  Filled 2016-05-06: qty 1

## 2016-05-06 MED ORDER — OXYCODONE-ACETAMINOPHEN 5-325 MG PO TABS: 1.0000 | ORAL_TABLET | ORAL | Status: DC | PRN

## 2016-05-06 MED ORDER — FENTANYL CITRATE (PF) 100 MCG/2ML IJ SOLN
INTRAMUSCULAR | Status: DC | PRN
  Administered 2016-05-06 (×2): 50 ug via INTRAVENOUS
  Administered 2016-05-06: 150 ug via INTRAVENOUS
  Administered 2016-05-06: 50 ug via INTRAVENOUS

## 2016-05-06 MED ORDER — PHENOL 1.4 % MT LIQD: 1.0000 | OROMUCOSAL | Status: DC | PRN

## 2016-05-06 MED ORDER — HYDROMORPHONE HCL 1 MG/ML IJ SOLN
INTRAMUSCULAR | Status: AC
  Filled 2016-05-06: qty 1

## 2016-05-06 MED ORDER — MIDAZOLAM HCL 2 MG/2ML IJ SOLN
INTRAMUSCULAR | Status: AC
  Filled 2016-05-06: qty 2

## 2016-05-06 MED ORDER — MELOXICAM 15 MG PO TABS
15.0000 mg | ORAL_TABLET | Freq: Every day | ORAL | Status: DC
  Filled 2016-05-06 (×2): qty 1

## 2016-05-06 MED ORDER — BUPIVACAINE-EPINEPHRINE (PF) 0.5% -1:200000 IJ SOLN
INTRAMUSCULAR | Status: DC | PRN
  Administered 2016-05-06: 10 mL

## 2016-05-06 MED ORDER — EPHEDRINE 5 MG/ML INJ
INTRAVENOUS | Status: AC
  Filled 2016-05-06: qty 10

## 2016-05-06 MED ORDER — CEFAZOLIN SODIUM 1 G IJ SOLR
INTRAMUSCULAR | Status: DC | PRN
  Administered 2016-05-06: 2 g via INTRAMUSCULAR

## 2016-05-06 MED ORDER — BISACODYL 10 MG RE SUPP: 10.0000 mg | Freq: Every day | RECTAL | Status: DC | PRN

## 2016-05-06 MED ORDER — BACITRACIN ZINC 500 UNIT/GM EX OINT
TOPICAL_OINTMENT | CUTANEOUS | Status: AC
  Filled 2016-05-06: qty 28.35

## 2016-05-06 MED ORDER — MUPIROCIN 2 % EX OINT
TOPICAL_OINTMENT | CUTANEOUS | Status: AC
  Administered 2016-05-06: 1 via TOPICAL
  Filled 2016-05-06: qty 22

## 2016-05-06 MED ORDER — MEPERIDINE HCL 25 MG/ML IJ SOLN
INTRAMUSCULAR | Status: AC
  Filled 2016-05-06: qty 1

## 2016-05-06 MED ORDER — HYDROMORPHONE HCL 1 MG/ML IJ SOLN: 0.2500 mg | INTRAMUSCULAR | Status: DC | PRN

## 2016-05-06 MED ORDER — MEPERIDINE HCL 25 MG/ML IJ SOLN
6.2500 mg | INTRAMUSCULAR | Status: DC | PRN
  Administered 2016-05-06: 12.5 mg via INTRAVENOUS

## 2016-05-06 MED ORDER — METOCLOPRAMIDE HCL 5 MG/ML IJ SOLN
10.0000 mg | Freq: Once | INTRAMUSCULAR | Status: DC | PRN
Start: 2016-05-06 — End: 2016-05-06

## 2016-05-06 MED ORDER — HEMOSTATIC AGENTS (NO CHARGE) OPTIME
TOPICAL | Status: DC | PRN
  Administered 2016-05-06: 1 via TOPICAL

## 2016-05-06 MED ORDER — DEXAMETHASONE SODIUM PHOSPHATE 10 MG/ML IJ SOLN
INTRAMUSCULAR | Status: AC
  Filled 2016-05-06: qty 2

## 2016-05-06 MED ORDER — MIDAZOLAM HCL 5 MG/5ML IJ SOLN
INTRAMUSCULAR | Status: DC | PRN
  Administered 2016-05-06: 2 mg via INTRAVENOUS

## 2016-05-06 MED ORDER — CEFAZOLIN SODIUM-DEXTROSE 2-4 GM/100ML-% IV SOLN
2.0000 g | Freq: Three times a day (TID) | INTRAVENOUS | Status: AC
  Administered 2016-05-06 – 2016-05-07 (×2): 2 g via INTRAVENOUS
  Filled 2016-05-06 (×2): qty 100

## 2016-05-06 MED ORDER — LIDOCAINE HCL (CARDIAC) 20 MG/ML IV SOLN
INTRAVENOUS | Status: DC | PRN
  Administered 2016-05-06: 60 mg via INTRAVENOUS

## 2016-05-06 MED ORDER — METHOCARBAMOL 500 MG PO TABS
500.0000 mg | ORAL_TABLET | Freq: Three times a day (TID) | ORAL | Status: DC | PRN
  Administered 2016-05-06: 500 mg via ORAL
  Filled 2016-05-06: qty 1

## 2016-05-06 MED ORDER — SUGAMMADEX SODIUM 200 MG/2ML IV SOLN
INTRAVENOUS | Status: DC | PRN
  Administered 2016-05-06: 131.6 mg via INTRAVENOUS

## 2016-05-06 MED ORDER — THROMBIN 5000 UNITS EX SOLR
CUTANEOUS | Status: DC | PRN
  Administered 2016-05-06 (×2): 5000 [IU] via TOPICAL

## 2016-05-06 SURGICAL SUPPLY — 49 items
APL SKNCLS STERI-STRIP NONHPOA (GAUZE/BANDAGES/DRESSINGS) ×1
BAG DECANTER FOR FLEXI CONT (MISCELLANEOUS) ×2 IMPLANT
BENZOIN TINCTURE PRP APPL 2/3 (GAUZE/BANDAGES/DRESSINGS) ×2 IMPLANT
BLADE CLIPPER SURG (BLADE) IMPLANT
BUR MATCHSTICK NEURO 3.0 LAGG (BURR) ×2 IMPLANT
BUR PRECISION FLUTE 6.0 (BURR) ×2 IMPLANT
CANISTER SUCT 3000ML PPV (MISCELLANEOUS) ×2 IMPLANT
DRAPE LAPAROTOMY 100X72X124 (DRAPES) ×2 IMPLANT
DRAPE MICROSCOPE LEICA (MISCELLANEOUS) ×2 IMPLANT
DRAPE POUCH INSTRU U-SHP 10X18 (DRAPES) ×2 IMPLANT
DRAPE SURG 17X23 STRL (DRAPES) ×8 IMPLANT
ELECT BLADE 4.0 EZ CLEAN MEGAD (MISCELLANEOUS) ×2
ELECT REM PT RETURN 9FT ADLT (ELECTROSURGICAL) ×2
ELECTRODE BLDE 4.0 EZ CLN MEGD (MISCELLANEOUS) ×1 IMPLANT
ELECTRODE REM PT RTRN 9FT ADLT (ELECTROSURGICAL) ×1 IMPLANT
GAUZE SPONGE 4X4 12PLY STRL (GAUZE/BANDAGES/DRESSINGS) ×2 IMPLANT
GAUZE SPONGE 4X4 16PLY XRAY LF (GAUZE/BANDAGES/DRESSINGS) IMPLANT
GLOVE BIO SURGEON STRL SZ 6.5 (GLOVE) ×1 IMPLANT
GLOVE BIO SURGEON STRL SZ7 (GLOVE) ×1 IMPLANT
GLOVE BIO SURGEON STRL SZ8 (GLOVE) ×3 IMPLANT
GLOVE BIO SURGEON STRL SZ8.5 (GLOVE) ×2 IMPLANT
GLOVE BIOGEL PI IND STRL 7.5 (GLOVE) IMPLANT
GLOVE BIOGEL PI IND STRL 8 (GLOVE) IMPLANT
GLOVE BIOGEL PI INDICATOR 7.5 (GLOVE) ×1
GLOVE BIOGEL PI INDICATOR 8 (GLOVE) ×2
GOWN STRL REUS W/ TWL LRG LVL3 (GOWN DISPOSABLE) IMPLANT
GOWN STRL REUS W/ TWL XL LVL3 (GOWN DISPOSABLE) ×1 IMPLANT
GOWN STRL REUS W/TWL 2XL LVL3 (GOWN DISPOSABLE) IMPLANT
GOWN STRL REUS W/TWL LRG LVL3 (GOWN DISPOSABLE) ×4
GOWN STRL REUS W/TWL XL LVL3 (GOWN DISPOSABLE) ×4
KIT BASIN OR (CUSTOM PROCEDURE TRAY) ×2 IMPLANT
KIT ROOM TURNOVER OR (KITS) ×2 IMPLANT
NDL HYPO 21X1.5 SAFETY (NEEDLE) IMPLANT
NEEDLE HYPO 21X1.5 SAFETY (NEEDLE) IMPLANT
NEEDLE HYPO 22GX1.5 SAFETY (NEEDLE) ×3 IMPLANT
NS IRRIG 1000ML POUR BTL (IV SOLUTION) ×2 IMPLANT
PACK LAMINECTOMY NEURO (CUSTOM PROCEDURE TRAY) ×2 IMPLANT
PAD ARMBOARD 7.5X6 YLW CONV (MISCELLANEOUS) ×6 IMPLANT
PATTIES SURGICAL .5 X1 (DISPOSABLE) IMPLANT
RUBBERBAND STERILE (MISCELLANEOUS) ×4 IMPLANT
SPONGE SURGIFOAM ABS GEL SZ50 (HEMOSTASIS) ×2 IMPLANT
STRIP CLOSURE SKIN 1/2X4 (GAUZE/BANDAGES/DRESSINGS) ×2 IMPLANT
SUT VIC AB 1 CT1 18XBRD ANBCTR (SUTURE) ×1 IMPLANT
SUT VIC AB 1 CT1 8-18 (SUTURE) ×2
SUT VIC AB 2-0 CP2 18 (SUTURE) ×2 IMPLANT
TAPE CLOTH SURG 4X10 WHT LF (GAUZE/BANDAGES/DRESSINGS) ×1 IMPLANT
TOWEL OR 17X24 6PK STRL BLUE (TOWEL DISPOSABLE) ×2 IMPLANT
TOWEL OR 17X26 10 PK STRL BLUE (TOWEL DISPOSABLE) ×2 IMPLANT
WATER STERILE IRR 1000ML POUR (IV SOLUTION) ×2 IMPLANT

## 2016-05-06 NOTE — Anesthesia Preprocedure Evaluation (Signed)
Anesthesia Evaluation  Patient identified by MRN, date of birth, ID band Patient awake    Reviewed: Allergy & Precautions, NPO status , Patient's Chart, lab work & pertinent test results  Airway Mallampati: II  TM Distance: >3 FB Neck ROM: Full    Dental no notable dental hx. (+) Teeth Intact   Pulmonary neg pulmonary ROS,    Pulmonary exam normal breath sounds clear to auscultation       Cardiovascular negative cardio ROS Normal cardiovascular exam Rhythm:Regular Rate:Normal     Neuro/Psych Hx/o Panic attacksnegative neurological ROS     GI/Hepatic negative GI ROS, Neg liver ROS,   Endo/Other  negative endocrine ROS  Renal/GU negative Renal ROS  negative genitourinary   Musculoskeletal Herniated disc L5-S1 with left radiculopathy   Abdominal   Peds  Hematology negative hematology ROS (+)   Anesthesia Other Findings   Reproductive/Obstetrics negative OB ROS                             Lab Results  Component Value Date   WBC 5.9 05/03/2016   HGB 13.2 05/03/2016   HCT 40.8 05/03/2016   MCV 88.1 05/03/2016   PLT 267 05/03/2016    Anesthesia Physical Anesthesia Plan  ASA: II  Anesthesia Plan: General   Post-op Pain Management:    Induction: Intravenous  Airway Management Planned: Oral ETT  Additional Equipment:   Intra-op Plan:   Post-operative Plan: Extubation in OR  Informed Consent: I have reviewed the patients History and Physical, chart, labs and discussed the procedure including the risks, benefits and alternatives for the proposed anesthesia with the patient or authorized representative who has indicated his/her understanding and acceptance.   Dental advisory given  Plan Discussed with: Anesthesiologist, CRNA and Surgeon  Anesthesia Plan Comments:         Anesthesia Quick Evaluation

## 2016-05-06 NOTE — Anesthesia Procedure Notes (Signed)
Procedure Name: Intubation Date/Time: 05/06/2016 4:28 PM Performed by: Oletta Lamas Pre-anesthesia Checklist: Patient identified, Emergency Drugs available, Suction available and Patient being monitored Patient Re-evaluated:Patient Re-evaluated prior to inductionOxygen Delivery Method: Circle System Utilized Preoxygenation: Pre-oxygenation with 100% oxygen Intubation Type: IV induction Ventilation: Mask ventilation without difficulty Laryngoscope Size: Miller and 2 Grade View: Grade I Tube type: Oral Tube size: 7.5 mm Number of attempts: 1 Airway Equipment and Method: Stylet Placement Confirmation: ETT inserted through vocal cords under direct vision,  positive ETCO2 and breath sounds checked- equal and bilateral Secured at: 22 cm Tube secured with: Tape Dental Injury: Teeth and Oropharynx as per pre-operative assessment  Difficulty Due To: Difficulty was unanticipated

## 2016-05-06 NOTE — Op Note (Signed)
Brief history: The patient is a 40 year old white female who has complained of left buttock and leg pain consistent with a lumbar radiculopathy. She has failed medical management. She was worked up with a lumbar MRI which demonstrated a herniated disc at L5-S1 on the left. I discussed the various treatment options with the patient including surgery. She has weighed the risks, benefits, and alternatives to surgery and decided to proceed with a left L5-S1 discectomy.  Preoperative diagnosis: Left L5-S1 herniated disc, lumbago, lumbar radiculopathy  Postoperative diagnosis: The same  Procedure: Left L5-S1 Intervertebral discectomy using micro-dissection  Surgeon: Dr. Earle Gell  Asst.: Dr. Erline Levine  Anesthesia: Gen. endotracheal  Estimated blood loss: Minimal  Drains: None  Complications: None  Description of procedure: The patient was brought to the operating room by the anesthesia team. General endotracheal anesthesia was induced. The patient was turned to the prone position on the Wilson frame. The patient's lumbosacral region was then prepared with Betadine scrub and Betadine solution. Sterile drapes were applied.  I then injected the area to be incised with Marcaine with epinephrine solution. I then used a scalpel to make a linear midline incision over the L5-S1 intervertebral disc space. I then used electrocautery to perform a left sided subperiosteal dissection exposing the spinous process and lamina of L5 and the upper sacrum. We obtained intraoperative radiograph to confirm our location. I then inserted the [facility] retractor for exposure.  We then brought the operative microscope into the field. Under its magnification and illumination we completed the microdissection. I used a high-speed drill to perform a laminotomy at L5-S1 on the left. I then used a Kerrison punches to widen the laminotomy and removed the ligamentum flavum at L5 S1 on the left. We then used microdissection  to free up the thecal sac and the left S1 nerve root from the epidural tissue. I then used a Kerrison punch to perform a foraminotomy at about the left S1 nerve root. We then using the nerve root retractor to gently retract the thecal sac and the left S1 nerve root medially. This exposed the intervertebral disc. We identified the ruptured disc and remove it with the pituitary forceps. We inspected the intervertebral disc. There was a small hole in the annulus but no impending herniation. We did not perform an intervertebral discectomy.  I then palpated along the ventral surface of the thecal sac and along exit route of the left S1 nerve root and noted that the neural structures were well decompressed. This completed the decompression.  We then obtained hemostasis using bipolar electrocautery. We irrigated the wound out with bacitracin solution. We then removed the retractor. We then reapproximated the patient's thoracolumbar fascia with interrupted #1 Vicryl suture. We then reapproximated the patient's subcutaneous tissue with interrupted 2-0 Vicryl suture. We then reapproximated patient's skin with Steri-Strips and benzoin. The was then coated with bacitracin ointment. The drapes were removed. The patient was subsequently returned to the supine position where they were extubated by the anesthesia team. The patient was then transported to the postanesthesia care unit in stable condition. All sponge instrument and needle counts were reportedly correct at the end of this case.

## 2016-05-06 NOTE — Transfer of Care (Signed)
Immediate Anesthesia Transfer of Care Note  Patient: Anna Tran  Procedure(s) Performed: Procedure(s) with comments: LEFT LUMBAR FIVE-SACRALONE DISCECTOMY (Left) - LEFT L5-S1 DISCECTOMY  Patient Location: PACU  Anesthesia Type:General  Level of Consciousness: awake  Airway & Oxygen Therapy: Patient Spontanous Breathing and Patient connected to nasal cannula oxygen  Post-op Assessment: Report given to RN and Post -op Vital signs reviewed and stable  Post vital signs: Reviewed and stable  Last Vitals:  Vitals:   05/06/16 1320 05/06/16 1810  BP: (!) 145/88 126/73  Pulse: (!) 109 (!) 113  Resp: 16 11  Temp: 37.2 C 36.5 C    Last Pain:  Vitals:   05/06/16 1810  TempSrc:   PainSc: 0-No pain      Patients Stated Pain Goal: 6 (33/54/56 2563)  Complications: No apparent anesthesia complications

## 2016-05-06 NOTE — H&P (Signed)
Subjective: The patient is a 40 year old white female who has complained of back and left buttocks and leg pain consistent with a lumbar radiculopathy. She has failed medical management and was worked up with a lumbar MRI. This demonstrated a herniated disc at L5-S1 on the left. I discussed the various treatment options with her. She has decided to proceed with surgery. Past Medical History:  Diagnosis Date  . Chronic back pain    herniated disc  . History of panic attacks    not on any meds  . Hyperlipidemia    hx of-took Fish Oil for a while. Last check over a yr ago and values were normal  . Slow urinary stream     Past Surgical History:  Procedure Laterality Date  . CESAREAN SECTION  2010   FTP  . COLONOSCOPY    . CYSTECTOMY  2008   cyst removed from groin  . WISDOM TOOTH EXTRACTION      Allergies  Allergen Reactions  . Amoxicillin Rash  . Sulfa Antibiotics Rash    Social History  Substance Use Topics  . Smoking status: Never Smoker  . Smokeless tobacco: Never Used  . Alcohol use Yes     Comment: on occasion    Family History  Problem Relation Age of Onset  . Hyperlipidemia Mother   . Depression Mother   . Hyperlipidemia Father   . Asthma Father   . Hypertension Father   . Cancer Paternal Grandmother     skin cancer  . Anesthesia problems Neg Hx   . Diabetes Neg Hx   . Heart attack Neg Hx   . Sudden death Neg Hx   . Colon cancer Neg Hx   . Breast cancer Neg Hx    Prior to Admission medications   Medication Sig Start Date End Date Taking? Authorizing Provider  gabapentin (NEURONTIN) 300 MG capsule Take 1 capsule (300 mg total) by mouth 3 (three) times daily. 05/03/16  Yes Samantha Tripp Dowless, PA-C  HYDROcodone-acetaminophen (NORCO) 5-325 MG tablet Take 1 tablet by mouth every 6 (six) hours as needed for moderate pain. 04/08/16  Yes Dene Gentry, MD  meloxicam (MOBIC) 15 MG tablet Take 1 tablet (15 mg total) by mouth daily. 04/08/16  Yes Dene Gentry, MD   methocarbamol (ROBAXIN) 500 MG tablet Take 1 tablet (500 mg total) by mouth every 8 (eight) hours as needed for muscle spasms. 04/08/16  Yes Dene Gentry, MD     Review of Systems  Positive ROS: As above  All other systems have been reviewed and were otherwise negative with the exception of those mentioned in the HPI and as above.  Objective: Vital signs in last 24 hours: Temp:  [99 F (37.2 C)] 99 F (37.2 C) (10/12 1320) Pulse Rate:  [109] 109 (10/12 1320) Resp:  [16] 16 (10/12 1320) BP: (145)/(88) 145/88 (10/12 1320) SpO2:  [97 %] 97 % (10/12 1320) Weight:  [65.8 kg (145 lb)] 65.8 kg (145 lb) (10/12 1320)  General Appearance: Alert, cooperative, no distress, Head: Normocephalic, without obvious abnormality, atraumatic Eyes: PERRL, conjunctiva/corneas clear, EOM's intact,    Ears: Normal  Throat: Normal  Neck: Supple, symmetrical, trachea midline, no adenopathy; thyroid: No enlargement/tenderness/nodules; no carotid bruit or JVD Back: Symmetric, no curvature, ROM normal, no CVA tenderness. She has a positive left straight leg raise test  Lungs: Clear to auscultation bilaterally, respirations unlabored Heart: Regular rate and rhythm, no murmur, rub or gallop Abdomen: Soft, non-tender,, no masses, no organomegaly  Extremities: Extremities normal, atraumatic, no cyanosis or edema Pulses: 2+ and symmetric all extremities Skin: Skin color, texture, turgor normal, no rashes or lesions  NEUROLOGIC:   Mental status: alert and oriented, no aphasia, good attention span, Fund of knowledge/ memory ok Motor Exam - grossly normal Sensory Exam - grossly normal Reflexes:  he has a decreased left ankle reflex. Coordination - grossly normal Gait - grossly normal Balance - grossly normal Cranial Nerves: I: smell Not tested  II: visual acuity  OS: Normal  OD: Normal   II: visual fields Full to confrontation  II: pupils Equal, round, reactive to light  III,VII: ptosis None   III,IV,VI: extraocular muscles  Full ROM  V: mastication Normal  V: facial light touch sensation  Normal  V,VII: corneal reflex  Present  VII: facial muscle function - upper  Normal  VII: facial muscle function - lower Normal  VIII: hearing Not tested  IX: soft palate elevation  Normal  IX,X: gag reflex Present  XI: trapezius strength  5/5  XI: sternocleidomastoid strength 5/5  XI: neck flexion strength  5/5  XII: tongue strength  Normal    Data Review Lab Results  Component Value Date   WBC 5.9 05/03/2016   HGB 13.2 05/03/2016   HCT 40.8 05/03/2016   MCV 88.1 05/03/2016   PLT 267 05/03/2016   Lab Results  Component Value Date   NA 138 05/03/2016   K 3.8 05/03/2016   CL 105 05/03/2016   CO2 25 05/03/2016   BUN 8 05/03/2016   CREATININE 0.63 05/03/2016   GLUCOSE 90 05/03/2016   No results found for: INR, PROTIME  Assessment/Plan: Left L5-S1 herniated disc, lumbago, lumbar radiculopathy: I discussed situation with the patient. I reviewed her imaging studies with her and pointed out the abnormalities. We have discussed the various treatment options including surgery. I described the surgical treatment option of left L5-S1 discectomy. We have discussed the risks, benefits, alternatives, expected postoperative course, and likelihood of achieving her goals with surgery. I have answered all the patient's questions. She has decided to proceed with surgery.   Yeriel Mineo D 05/06/2016 4:10 PM

## 2016-05-07 ENCOUNTER — Encounter (HOSPITAL_COMMUNITY): Payer: Self-pay | Admitting: Neurosurgery

## 2016-05-07 ENCOUNTER — Encounter: Payer: Self-pay | Admitting: Family Medicine

## 2016-05-07 DIAGNOSIS — M5117 Intervertebral disc disorders with radiculopathy, lumbosacral region: Secondary | ICD-10-CM | POA: Diagnosis not present

## 2016-05-07 MED ORDER — OXYCODONE-ACETAMINOPHEN 5-325 MG PO TABS: 1.0000 | ORAL_TABLET | ORAL | 0 refills | Status: DC | PRN

## 2016-05-07 MED ORDER — DOCUSATE SODIUM 100 MG PO CAPS: 100.0000 mg | ORAL_CAPSULE | Freq: Two times a day (BID) | ORAL | 0 refills | Status: DC

## 2016-05-07 NOTE — Evaluation (Signed)
Physical Therapy Evaluation Patient Details Name: Anna Tran MRN: [id number] DOB: March 26, 1976 Today's Date: 05/07/2016   History of Present Illness  pt presents post L5-S1 Discectomy.  pt with hx of Panic Attacks.    Clinical Impression  Pt moving well and demonstrates good awareness of safety.  Pt ed on back precautions and activity at home with young children.  Feel pt is ready for D/C to home with family support.  No further PT needs at this time.      Follow Up Recommendations No PT follow up;Supervision - Intermittent    Equipment Recommendations  None recommended by PT    Recommendations for Other Services       Precautions / Restrictions Precautions Precautions: Back Precaution Booklet Issued: No Precaution Comments: Reviewed precautions.  RN to provide handout with D/C instructions. Restrictions Weight Bearing Restrictions: No      Mobility  Bed Mobility Overal bed mobility: Modified Independent                Transfers Overall transfer level: Modified independent Equipment used: None                Ambulation/Gait Ambulation/Gait assistance: Modified independent (Device/Increase time) Ambulation Distance (Feet): 300 Feet Assistive device: None Gait Pattern/deviations: Step-through pattern;Decreased stride length     General Gait Details: pt moves cautiously, but without deficit.    Stairs Stairs: Yes Stairs assistance: Modified independent (Device/Increase time) Stair Management: One rail Left;Alternating pattern;Forwards Number of Stairs: 11 General stair comments: pt moves slowly and demonstrates good safety on stairs.  Wheelchair Mobility    Modified Rankin (Stroke Patients Only)       Balance Overall balance assessment: Needs assistance Sitting-balance support: No upper extremity supported;Feet supported Sitting balance-Leahy Scale: Good     Standing balance support: No upper extremity supported;During functional  activity Standing balance-Leahy Scale: Good                               Pertinent Vitals/Pain Pain Assessment: 0-10 Pain Score: 3  Pain Location: low back and cramp in L posterior thigh. Pain Descriptors / Indicators: Cramping;Sore Pain Intervention(s): Monitored during session;Repositioned    Home Living Family/patient expects to be discharged to:: Private residence Living Arrangements: Spouse/significant other;Children Available Help at Discharge: Family;Available PRN/intermittently Type of Home: House Home Access: Stairs to enter Entrance Stairs-Rails: Right Entrance Stairs-Number of Steps: 3 Home Layout: Two level;Bed/bath upstairs;1/2 bath on main level Home Equipment: None      Prior Function Level of Independence: Independent               Hand Dominance        Extremity/Trunk Assessment   Upper Extremity Assessment: Overall WFL for tasks assessed           Lower Extremity Assessment: Overall WFL for tasks assessed      Cervical / Trunk Assessment: Normal  Communication   Communication: No difficulties  Cognition Arousal/Alertness: Awake/alert Behavior During Therapy: WFL for tasks assessed/performed Overall Cognitive Status: Within Functional Limits for tasks assessed                      General Comments      Exercises     Assessment/Plan    PT Assessment Patent does not need any further PT services  PT Problem List            PT Treatment Interventions  PT Goals (Current goals can be found in the Care Plan section)  Acute Rehab PT Goals Patient Stated Goal: Home PT Goal Formulation: All assessment and education complete, DC therapy    Frequency     Barriers to discharge        Co-evaluation               End of Session   Activity Tolerance: Patient tolerated treatment well Patient left: with family/visitor present (standing in room with family) Nurse Communication: Mobility status     Functional Assessment Tool Used: Clinical Judgement Functional Limitation: Mobility: Walking and moving around Mobility: Walking and Moving Around Current Status (I2641): 0 percent impaired, limited or restricted Mobility: Walking and Moving Around Goal Status [phone]): 0 percent impaired, limited or restricted Mobility: Walking and Moving Around Discharge Status [phone]): 0 percent impaired, limited or restricted    Time: 0881-1031 PT Time Calculation (min) (ACUTE ONLY): 16 min   Charges:   PT Evaluation $PT Eval Low Complexity: 1 Procedure     PT G Codes:   PT G-Codes **NOT FOR INPATIENT CLASS** Functional Assessment Tool Used: Clinical Judgement Functional Limitation: Mobility: Walking and moving around Mobility: Walking and Moving Around Current Status (R9458): 0 percent impaired, limited or restricted Mobility: Walking and Moving Around Goal Status ([id number]): 0 percent impaired, limited or restricted Mobility: Walking and Moving Around Discharge Status (K4628): 0 percent impaired, limited or restricted    Catarina Hartshorn, Ranger 05/07/2016, 8:52 AM

## 2016-05-07 NOTE — Discharge Summary (Signed)
Physician Discharge Summary  Patient ID: Anna Tran MRN: [id number] DOB/AGE: [id number] y.o.  Admit date: 05/06/2016 Discharge date: 05/07/2016  Admission Diagnoses:Left L5-S1 herniated disc, lumbago, lumbar radiculopathy  Discharge Diagnoses: The same Active Problems:   Lumbar herniated disc   Discharged Condition: good  Hospital Course: I performed a left L5-S1 discectomy on the patient on 05/06/2016. The surgery went well.  The patient's postoperative course was unremarkable. On postoperative day #1 the patient requested discharge home. Her leg pain was gone. She was given written and oral discharge instructions. All questions were answered.  Consults: None Significant Diagnostic Studies: None Treatments: Left L5-S1 discectomy using microdissection Discharge Exam: Blood pressure (!) 93/57, pulse (!) 58, temperature 97.8 F (36.6 C), temperature source Oral, resp. rate 18, height 5' 5"  (1.651 m), weight 65.8 kg (145 lb), last menstrual period 04/20/2016, SpO2 97 %. The patient is alert and pleasant. Her dressing is clean and dry. Her strength is normal. She looks well.  Disposition: Home  Discharge Instructions    Call MD for:  difficulty breathing, headache or visual disturbances    Complete by:  As directed    Call MD for:  extreme fatigue    Complete by:  As directed    Call MD for:  hives    Complete by:  As directed    Call MD for:  persistant dizziness or light-headedness    Complete by:  As directed    Call MD for:  persistant nausea and vomiting    Complete by:  As directed    Call MD for:  redness, tenderness, or signs of infection (pain, swelling, redness, odor or green/yellow discharge around incision site)    Complete by:  As directed    Call MD for:  severe uncontrolled pain    Complete by:  As directed    Call MD for:  temperature >100.4    Complete by:  As directed    Diet - low sodium heart healthy    Complete by:  As directed    Discharge  instructions    Complete by:  As directed    Call [phone] for a followup appointment. Take a stool softener while you are using pain medications.   Driving Restrictions    Complete by:  As directed    Do not drive for 2 weeks.   Increase activity slowly    Complete by:  As directed    Lifting restrictions    Complete by:  As directed    Do not lift more than 5 pounds. No excessive bending or twisting.   May shower / Bathe    Complete by:  As directed    He may shower after the pain she is removed 3 days after surgery. Leave the incision alone.   Remove dressing in 48 hours    Complete by:  As directed    Your stitches are under the scan and will dissolve by themselves. The Steri-Strips will fall off after you take a few showers. Do not rub back or pick at the wound, Leave the wound alone.       Medication List    STOP taking these medications   HYDROcodone-acetaminophen 5-325 MG tablet Commonly known as:  NORCO     TAKE these medications   docusate sodium 100 MG capsule Commonly known as:  COLACE Take 1 capsule (100 mg total) by mouth 2 (two) times daily.   gabapentin 300 MG capsule Commonly known as:  NEURONTIN Take  1 capsule (300 mg total) by mouth 3 (three) times daily.   meloxicam 15 MG tablet Commonly known as:  MOBIC Take 1 tablet (15 mg total) by mouth daily.   methocarbamol 500 MG tablet Commonly known as:  ROBAXIN Take 1 tablet (500 mg total) by mouth every 8 (eight) hours as needed for muscle spasms.   oxyCODONE-acetaminophen 5-325 MG tablet Commonly known as:  PERCOCET/ROXICET Take 1-2 tablets by mouth every 4 (four) hours as needed for moderate pain.        SignedOphelia Charter 05/07/2016, 7:39 AM

## 2016-05-07 NOTE — Anesthesia Postprocedure Evaluation (Signed)
Anesthesia Post Note  Patient: Anna Tran  Procedure(s) Performed: Procedure(s) (LRB): LEFT LUMBAR FIVE-SACRALONE DISCECTOMY (Left)  Patient location during evaluation: PACU Anesthesia Type: General Level of consciousness: awake and alert Pain management: pain level controlled Vital Signs Assessment: post-procedure vital signs reviewed and stable Respiratory status: spontaneous breathing, nonlabored ventilation and respiratory function stable Cardiovascular status: blood pressure returned to baseline and stable Postop Assessment: no signs of nausea or vomiting Anesthetic complications: no    Last Vitals:  Vitals:   05/07/16 0430 05/07/16 0801  BP: (!) 93/57 105/65  Pulse: (!) 58 (!) 51  Resp: 18 18  Temp: 36.6 C 36.8 C    Last Pain:  Vitals:   05/07/16 0430  TempSrc: Oral  PainSc:                  Valencia Kassa A

## 2016-05-07 NOTE — Progress Notes (Signed)
Patient alert and oriented, mae's well, voiding adequate amount of urine, swallowing without difficulty, c/o mild pain. Patient discharged home with family. Script and discharged instructions given to patient. Patient and family stated understanding of instructions given.

## 2016-07-13 ENCOUNTER — Telehealth: Payer: Self-pay

## 2016-07-13 NOTE — Telephone Encounter (Signed)
Rcvd records request for pt from Dr. Tomi Bamberger to Ritchie @ [facility]. Records faxed. Victorino December

## 2019-11-06 ENCOUNTER — Encounter: Payer: Self-pay | Admitting: Family Medicine

## 2019-11-06 ENCOUNTER — Ambulatory Visit: Payer: BC Managed Care – PPO | Admitting: Family Medicine

## 2019-11-06 ENCOUNTER — Other Ambulatory Visit: Payer: Self-pay

## 2019-11-06 VITALS — BP 140/90 | HR 89 | Ht 65.0 in | Wt 158.0 lb

## 2019-11-06 DIAGNOSIS — R29898 Other symptoms and signs involving the musculoskeletal system: Secondary | ICD-10-CM | POA: Diagnosis not present

## 2019-11-06 DIAGNOSIS — M5416 Radiculopathy, lumbar region: Secondary | ICD-10-CM

## 2019-11-06 MED ORDER — OXYCODONE-ACETAMINOPHEN 5-325 MG PO TABS: 1.0000 | ORAL_TABLET | Freq: Three times a day (TID) | ORAL | 0 refills | Status: DC | PRN

## 2019-11-06 MED ORDER — PREDNISONE 10 MG (48) PO TBPK: ORAL_TABLET | Freq: Every day | ORAL | 0 refills | Status: DC

## 2019-11-06 MED ORDER — LORAZEPAM 0.5 MG PO TABS: ORAL_TABLET | ORAL | 0 refills | Status: DC

## 2019-11-06 MED ORDER — GABAPENTIN 300 MG PO CAPS: 300.0000 mg | ORAL_CAPSULE | Freq: Three times a day (TID) | ORAL | 1 refills | Status: DC | PRN

## 2019-11-06 NOTE — Progress Notes (Signed)
done

## 2019-11-06 NOTE — Patient Instructions (Signed)
Thank you for coming in today. Plan for Prednisone, gabapentin.  Use oxycodone sparingly.  Ok to attend PT as ordered.  Hold off on the 2nd COVID shot for a little while.   Pay attention to the weakness in the left foot. If weakness if rapidly progressing let me know.   Anticipate MRI this weekend.   Recheck after MRI.   Come back or go to the emergency room if you notice new weakness new numbness problems walking or bowel or bladder problems.   Radicular Pain Radicular pain is a type of pain that spreads from your back or neck along a spinal nerve. Spinal nerves are nerves that leave the spinal cord and go to the muscles. Radicular pain is sometimes called radiculopathy, radiculitis, or a pinched nerve. When you have this type of pain, you may also have weakness, numbness, or tingling in the area of your body that is supplied by the nerve. The pain may feel sharp and burning. Depending on which spinal nerve is affected, the pain may occur in the:  Neck area (cervical radicular pain). You may also feel pain, numbness, weakness, or tingling in the arms.  Mid-spine area (thoracic radicular pain). You would feel this pain in the back and chest. This type is rare.  Lower back area (lumbar radicular pain). You would feel this pain as low back pain. You may feel pain, numbness, weakness, or tingling in the buttocks or legs. Sciatica is a type of lumbar radicular pain that shoots down the back of the leg. Radicular pain occurs when one of the spinal nerves becomes irritated or squeezed (compressed). It is often caused by something pushing on a spinal nerve, such as one of the bones of the spine (vertebrae) or one of the round cushions between vertebrae (intervertebral disks). This can result from:  An injury.  Wear and tear or aging of a disk.  The growth of a bone spur that pushes on the nerve. Radicular pain often goes away when you follow instructions from your health care provider for  relieving pain at home. Follow these instructions at home: Managing pain      If directed, put ice on the affected area: ? Put ice in a plastic bag. ? Place a towel between your skin and the bag. ? Leave the ice on for 20 minutes, 2-3 times a day.  If directed, apply heat to the affected area as often as told by your health care provider. Use the heat source that your health care provider recommends, such as a moist heat pack or a heating pad. ? Place a towel between your skin and the heat source. ? Leave the heat on for 20-30 minutes. ? Remove the heat if your skin turns bright red. This is especially important if you are unable to feel pain, heat, or cold. You may have a greater risk of getting burned. Activity   Do not sit or rest in bed for long periods of time.  Try to stay as active as possible. Ask your health care provider what type of exercise or activity is best for you.  Avoid activities that make your pain worse, such as bending and lifting.  Do not lift anything that is heavier than 10 lb (4.5 kg), or the limit that you are told, until your health care provider says that it is safe.  Practice using proper technique when lifting items. Proper lifting technique involves bending your knees and rising up.  Do strength and range-of-motion  exercises only as told by your health care provider or physical therapist. General instructions  Take over-the-counter and prescription medicines only as told by your health care provider.  Pay attention to any changes in your symptoms.  Keep all follow-up visits as told by your health care provider. This is important. ? Your health care provider may send you to a physical therapist to help with this pain. Contact a health care provider if:  Your pain and other symptoms get worse.  Your pain medicine is not helping.  Your pain has not improved after a few weeks of home care.  You have a fever. Get help right away if:  You  have severe pain, weakness, or numbness.  You have difficulty with bladder or bowel control. Summary  Radicular pain is a type of pain that spreads from your back or neck along a spinal nerve.  When you have radicular pain, you may also have weakness, numbness, or tingling in the area of your body that is supplied by the nerve.  The pain may feel sharp or burning.  Radicular pain may be treated with ice, heat, medicines, or physical therapy. This information is not intended to replace advice given to you by your health care provider. Make sure you discuss any questions you have with your health care provider. Document Revised: 01/24/2018 Document Reviewed: 01/24/2018 Elsevier Patient Education  [employer].

## 2019-11-06 NOTE — Progress Notes (Signed)
Subjective:    CC: L leg pain / L sciatica  I, Judy Pimple, am serving as a scribe for Dr. [name].  HPI: Pt is a 44 y/o female presenting w/ c/o L leg pain.  Pt has a hx of a L L5 discectomy on 05/07/16.  She rates her pain as 8/10 and describes her pain as shooting X2 weeks.  Pain is located the lateral calf.  Pain worse with activity and at sleep.  Pain somewhat better with sitting forward. She notes that she is having some weakness especially with foot and great toe dorsiflexion.  She denies any bowel or bladder dysfunction.  She is tried some leftover oxycodone from her back surgery 2017 that helps her sleep a bit.  Her leftover gabapentin for 4 years ago has not been very helpful.  She was offered steroids when she saw a PA yesterday but declined it because she has her second COVID-19 vaccine this Friday coming up.  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  Patient did see a chiropractor already.  She had x-rays that showed some evidence of muscle spasm.  Radiating pain: Yes in L leg from L lower back to lateral L calf L LE numbness/tingling: yes in L foot L LE weakness: no  Aggravating factors Laying down or sitting too long: Treatments tried: Methocarbamol, Gabapentin, ibuprofen, oxycodone   Diagnostic testing: L-spine XR- 03/04/16 and 05/06/16; L-spine MRI- 03/06/16  Pertinent review of Systems: No fevers or chills  Relevant historical information: History left discectomy L5-S1 2017 Dr. [name]   Objective:    Vitals:   11/06/19 1103  BP: 140/90  Pulse: 89  SpO2: 99%   General: Well Developed, well nourished, and in no acute distress.   MSK:  L-spine: Nontender spinal midline.  Lumbar motion limited flexion normal extension rotation lateral flexion. Lower extremity reflexes equal bilaterally. Lower extremity strength intact except left ankle dorsiflexion 4/5 great toe dorsiflexion 3/5. Pulses  capillary fill and sensation are intact distally. Mild antalgic gait.  Lab and Radiology Results  X-Wiegman images L-spine provided by patient from chiropractor visit personally and independently reviewed. Slight loss of lower lumbar lordosis and slight rightward scoliotic angle lumbar spine. No acute fractures visible. Mild L5-S1 DDD Radiology overread not present   Impression and Recommendations:    Assessment and Plan: 44 y.o. female with left lumbar radiculopathy left leg at L5 ongoing for about 2 weeks.  Patient is having progressive worsening neurological symptoms with weakness with foot and great toe dorsiflexion.  Plan for course of prednisone, refill of gabapentin.  Hopefully the nonexpired gabapentin will be a little bit more effective.  Additionally refill limited oxycodone.  Plan for MRI lumbar spine to evaluate radiculopathy and lower extremity weakness.  MRI done sooner than otherwise would because of weakness. Patient notes she is claustrophobic we will use Ativan during MRI.  Caution against driving or use of Ativan with oxycodone. . Recheck following MRI.  Gave patient precautions to pay attention to especially progressive weakness or bowel bladder dysfunction.   Orders Placed This Encounter  Procedures  . MR Lumbar Spine Wo Contrast    Standing Status:   Future    Standing Expiration Date:   01/05/2021    Order Specific Question:   What is the patient's sedation requirement?    Answer:   Anti-anxiety    Order Specific Question:   Does the patient have a pacemaker  or implanted devices?    Answer:   No    Order Specific Question:   Preferred imaging location?    Answer:   [facility] (table limit-350lbs)    Order Specific Question:   Radiology Contrast Protocol - do NOT remove file path    Answer:   [email or web address]   Meds ordered this encounter  Medications  . gabapentin (NEURONTIN) 300 MG capsule    Sig: Take 1-2 capsules  (300-600 mg total) by mouth 3 (three) times daily as needed.    Dispense:  90 capsule    Refill:  1  . oxyCODONE-acetaminophen (PERCOCET/ROXICET) 5-325 MG tablet    Sig: Take 1-2 tablets by mouth every 8 (eight) hours as needed for moderate pain.    Dispense:  15 tablet    Refill:  0  . predniSONE (STERAPRED UNI-PAK 48 TAB) 10 MG (48) TBPK tablet    Sig: Take by mouth daily. 12 day dosepack po    Dispense:  48 tablet    Refill:  0  . LORazepam (ATIVAN) 0.5 MG tablet    Sig: 1-2 tabs 30 - 60 min prior to MRI. Do not drive with this medicine.    Dispense:  4 tablet    Refill:  0    Discussed warning signs or symptoms. Please see discharge instructions. Patient expresses understanding.   The above documentation has been reviewed and is accurate and complete [name]

## 2019-11-12 ENCOUNTER — Ambulatory Visit: Payer: BC Managed Care – PPO | Admitting: Family Medicine

## 2019-11-17 ENCOUNTER — Ambulatory Visit (INDEPENDENT_AMBULATORY_CARE_PROVIDER_SITE_OTHER): Payer: BC Managed Care – PPO

## 2019-11-17 ENCOUNTER — Other Ambulatory Visit: Payer: Self-pay

## 2019-11-17 DIAGNOSIS — M5416 Radiculopathy, lumbar region: Secondary | ICD-10-CM

## 2019-11-17 DIAGNOSIS — R29898 Other symptoms and signs involving the musculoskeletal system: Secondary | ICD-10-CM | POA: Diagnosis not present

## 2019-11-17 IMAGING — MR MR LUMBAR SPINE W/O CM
4 of 5 series · 25 of 48 positions shown · non-contrast
Comparison: Prior MRI from 03/06/2016.

CLINICAL DATA: Initial evaluation for progressive lower back pain.

EXAM:
MRI LUMBAR SPINE WITHOUT CONTRAST
TECHNIQUE: Multiplanar, multisequence MR imaging of the lumbar spine was
performed. No intravenous contrast was administered.

[Series 2: T2 · sagittal · 4.0mm · 0.81mm/px · 6 of 15 slices shown (1 of 2)]
[im 1/15]
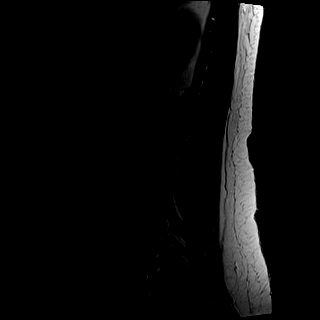
[im 3/15]
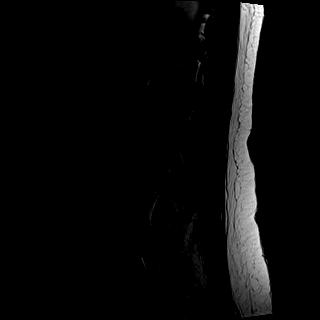
[im 6/15]
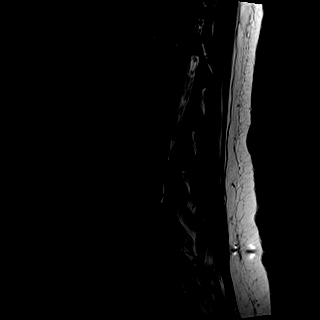
[im 9/15]
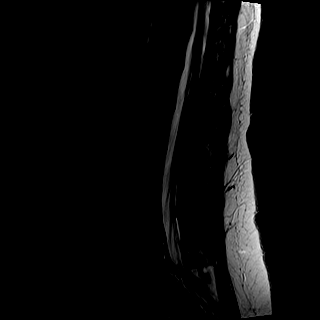
[im 12/15]
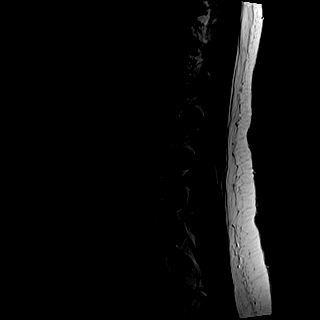
[im 15/15]
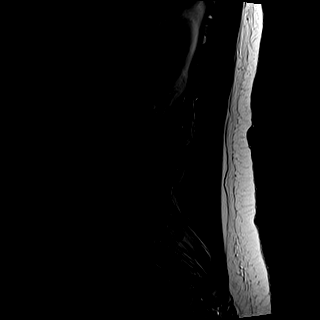

[Series 3: T1 · sagittal · 4.0mm · 0.41mm/px · 6 of 15 slices shown (1 of 2)]
[im 1/15]
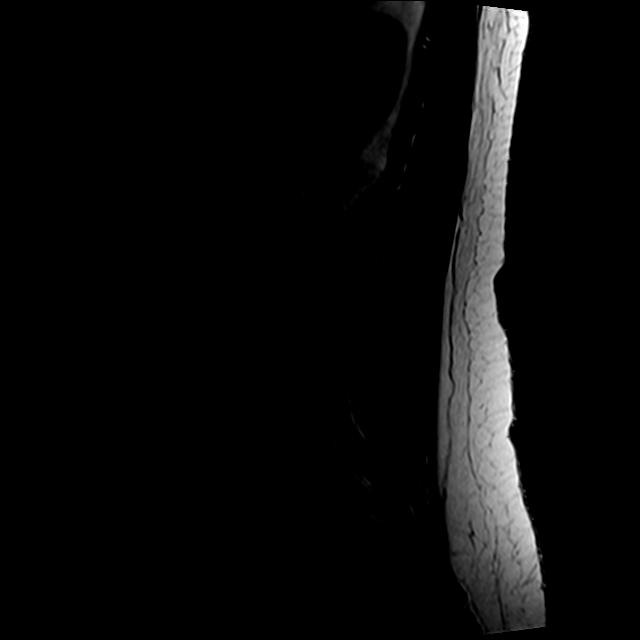
[im 3/15]
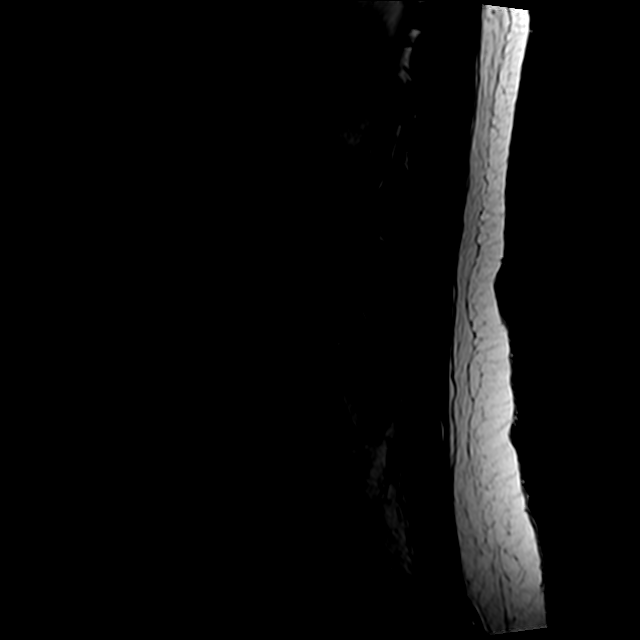
[im 6/15]
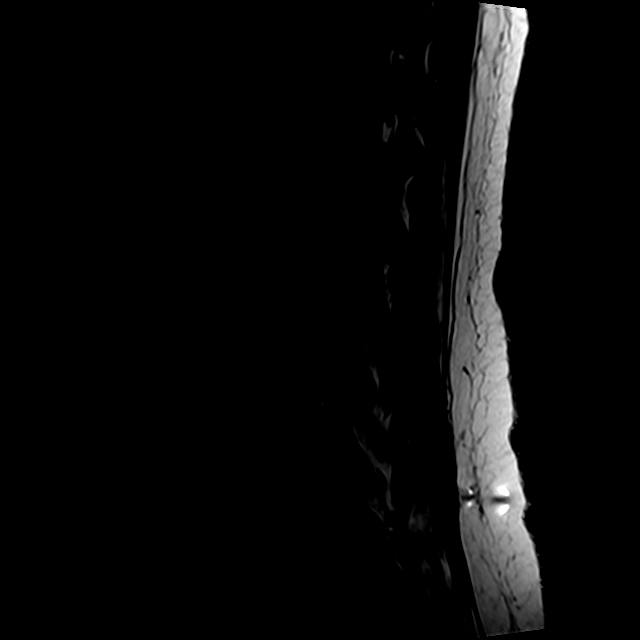
[im 9/15]
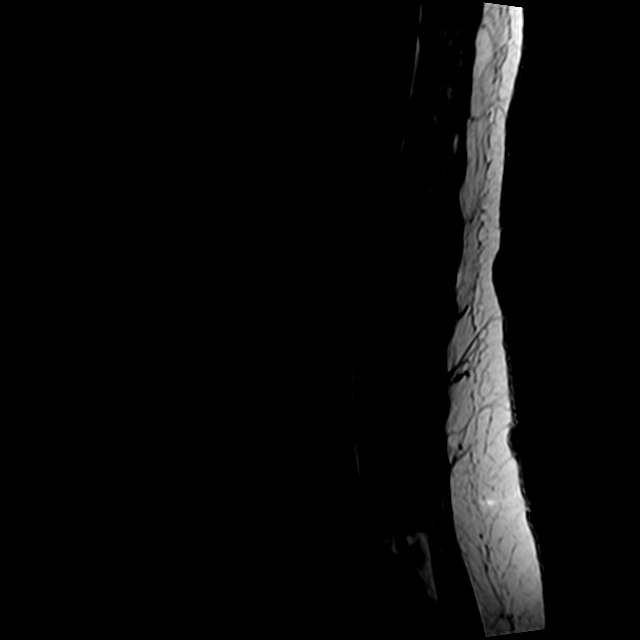
[im 12/15]
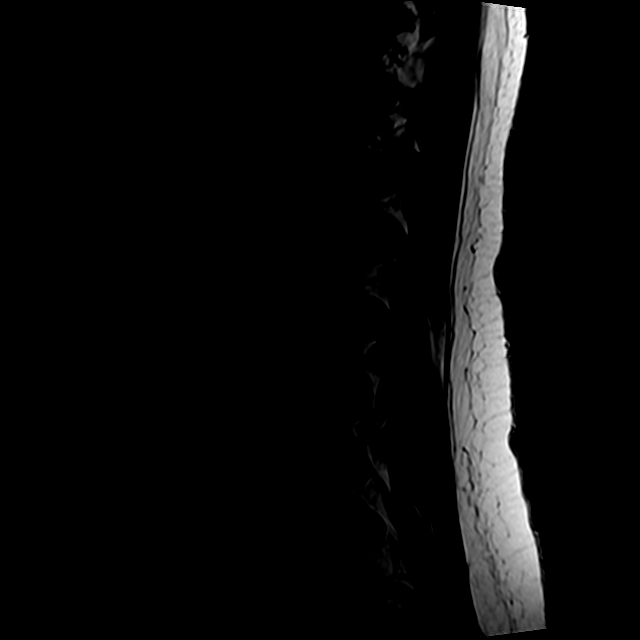
[im 15/15]
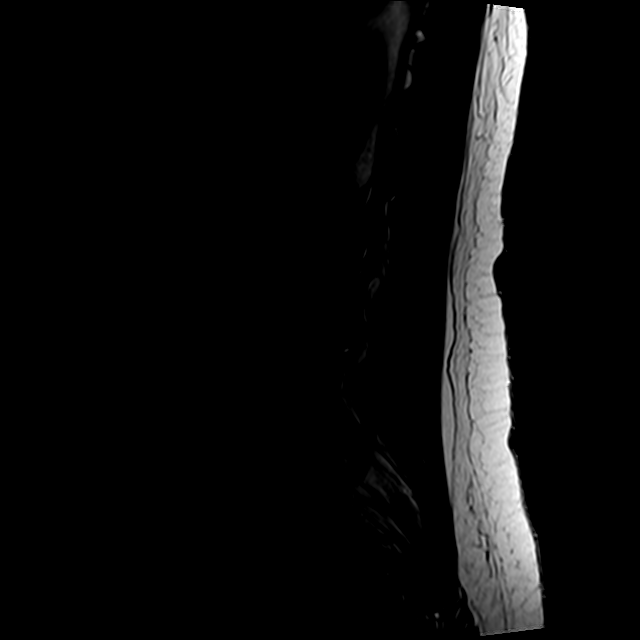

[Series 5: T2 · axial · 4.0mm · 0.78mm/px · z∈[-54,+165]mm · 9 of 40 slices shown (2 of 2)]
[im 1/40]
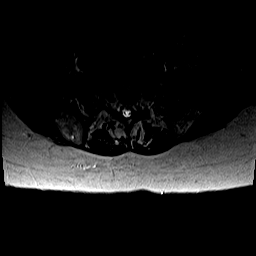
[im 6/40]
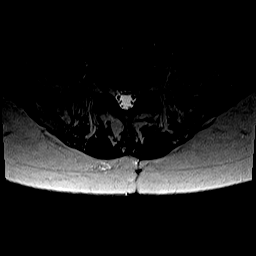
[im 12/40]
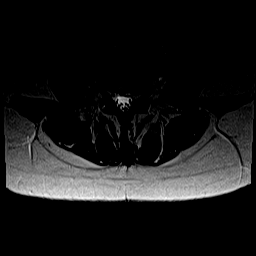
[im 17/40]
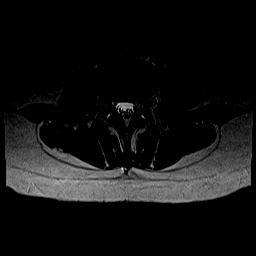
[im 20/40]
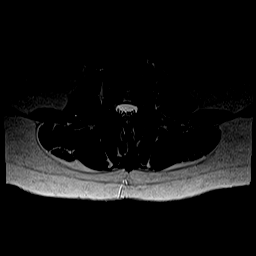
[im 23/40]
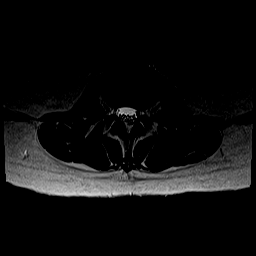
[im 28/40]
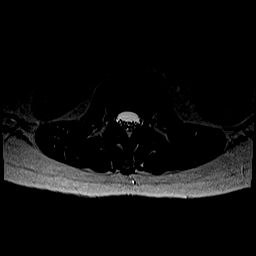
[im 34/40]
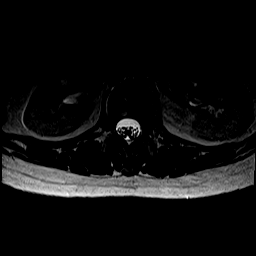
[im 40/40]
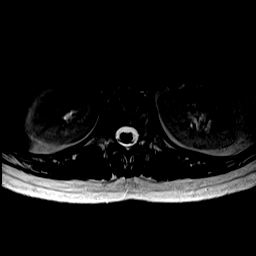

[Series 6: T1 · axial · 4.0mm · 0.39mm/px · z∈[-54,+135]mm · 4 of 40 slices shown (2 of 2)]
[im 1/40]
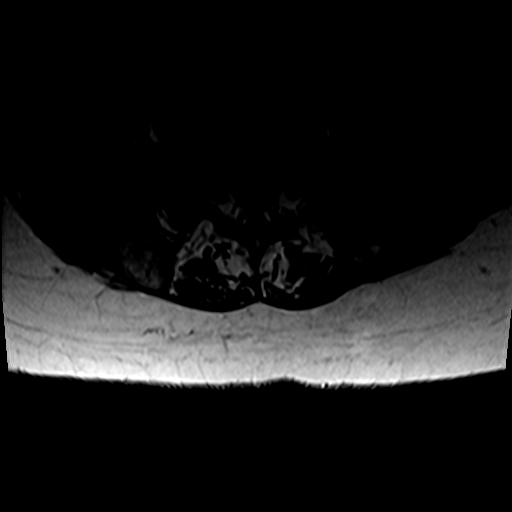
[im 6/40]
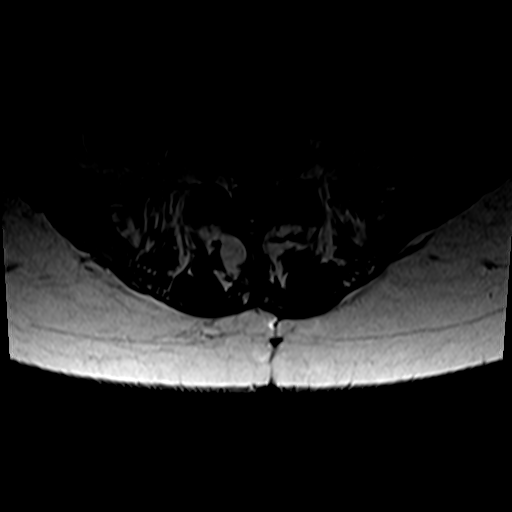
[im 20/40]
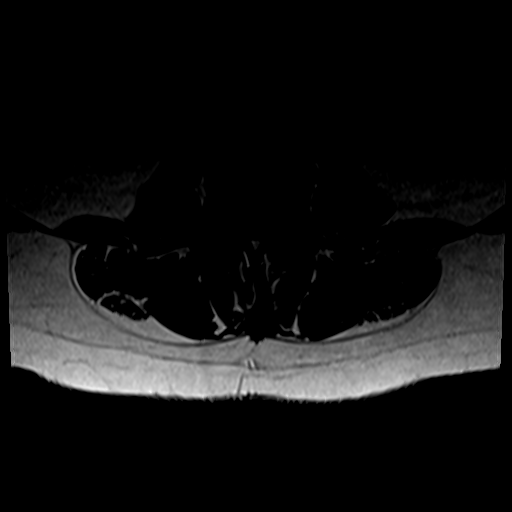
[im 34/40]
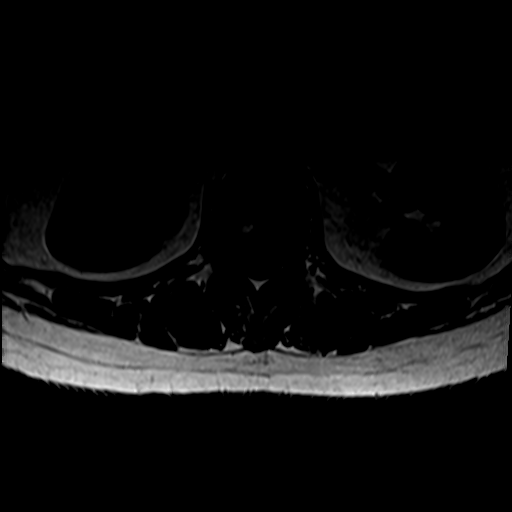

[25 of 48 positions shown; findings below may reference images not displayed]

FINDINGS: Segmentation: Standard. Lowest well-formed disc space labeled the
L5-S1 level.

Alignment: Trace 2 mm anterolisthesis of L5 on S1, chronic and facet
mediated. Alignment otherwise normal preservation of the normal
lumbar lordosis.

Vertebrae: Vertebral body height maintained without evidence for
acute or chronic fracture. Bone marrow signal intensity within
normal limits. Few scattered benign hemangiomata noted. No worrisome
osseous lesions. Mild reactive endplate changes present about the
L4-5 and L5-S1 interspaces. No other abnormal marrow edema.

Conus medullaris and cauda equina: Conus extends to the L1 level.
Conus and cauda equina appear normal.

Paraspinal and other soft tissues: Chronic postoperative scarring
present within the lower posterior paraspinous soft tissues.
Paraspinous soft tissues demonstrate no acute finding. Small simple
cyst noted at the lower pole the right kidney. Visualized visceral
structures otherwise unremarkable.

Disc levels:

L1-2:  Unremarkable.

L2-3:  Unremarkable.

L3-4:  Unremarkable.

L4-5: Trace anterolisthesis. Mild diffuse disc bulge with disc
desiccation and intervertebral disc space narrowing. Superimposed
broad-based left subarticular disc protrusion extends into the left
lateral recess, impinging upon the descending left L5 nerve root.
Mild bilateral facet hypertrophy. Resultant mild canal with severe
left lateral recess stenosis. Foramina remain patent.

L5-S1: Diffuse disc bulge with disc desiccation. Postoperative
changes from prior left laminotomy with micro mastectomy. No
appreciable residual or recurrent disc herniation. No significant
canal or lateral recess stenosis. Foramina remain patent.
IMPRESSION: 1. Broad left subarticular disc protrusion at L4-5, impinging upon
the descending left L5 nerve root in the left lateral recess.
2. Sequelae of prior left laminotomy with micro mastectomy at L5-S1
without residual or recurrent disc herniation.

## 2019-11-19 ENCOUNTER — Encounter: Payer: Self-pay | Admitting: Family Medicine

## 2019-11-19 ENCOUNTER — Ambulatory Visit (INDEPENDENT_AMBULATORY_CARE_PROVIDER_SITE_OTHER): Payer: BC Managed Care – PPO | Admitting: Family Medicine

## 2019-11-19 ENCOUNTER — Other Ambulatory Visit: Payer: Self-pay

## 2019-11-19 VITALS — BP 144/68 | HR 80 | Ht 65.0 in | Wt 156.6 lb

## 2019-11-19 DIAGNOSIS — M5416 Radiculopathy, lumbar region: Secondary | ICD-10-CM

## 2019-11-19 NOTE — Patient Instructions (Signed)
Thank you for coming in today. Call [phone] [facility] to schedule the epidural.  Let me know how you feel after.  OK to get covid shot around the same time. Try to space the covid shot a few days  before or after the epidural.   Epidural Steroid Injection Patient Information  Description: The epidural space surrounds the nerves as they exit the spinal cord.  In some patients, the nerves can be compressed and inflamed by a bulging disc or a tight spinal canal (spinal stenosis).  By injecting steroids into the epidural space, we can bring irritated nerves into direct contact with a potentially helpful medication.  These steroids act directly on the irritated nerves and can reduce swelling and inflammation which often leads to decreased pain.  Epidural steroids may be injected anywhere along the spine and from the neck to the low back depending upon the location of your pain.   After numbing the skin with local anesthetic (like Novocaine), a small needle is passed into the epidural space slowly.  You may experience a sensation of pressure while this is being done.  The entire block usually last less than 10 minutes.  Conditions which may be treated by epidural steroids:   Low back and leg pain  Neck and arm pain  Spinal stenosis  Post-laminectomy syndrome  Herpes zoster (shingles) pain  Pain from compression fractures  Preparation for the injection:  1. Do not eat any solid food or dairy products within 8 hours of your appointment.  2. You may drink clear liquids up to 3 hours before appointment.  Clear liquids include water, black coffee, juice or soda.  No milk or cream please. 3. You may take your regular medication, including pain medications, with a sip of water before your appointment  Diabetics should hold regular insulin (if taken separately) and take 1/2 normal NPH dos the morning of the procedure.  Carry some sugar containing items with you to your  appointment. 4. A driver must accompany you and be prepared to drive you home after your procedure.  5. Bring all your current medications with your. 6. An IV may be inserted and sedation may be given at the discretion of the physician.   7. A blood pressure cuff, EKG and other monitors will often be applied during the procedure.  Some patients may need to have extra oxygen administered for a short period. 8. You will be asked to provide medical information, including your allergies, prior to the procedure.  We must know immediately if you are taking blood thinners (like Coumadin/Warfarin)  Or if you are allergic to IV iodine contrast (dye). We must know if you could possible be pregnant.  Possible side-effects:  Bleeding from needle site  Infection (rare, may require surgery)  Nerve injury (rare)  Numbness & tingling (temporary)  Difficulty urinating (rare, temporary)  Spinal headache ( a headache worse with upright posture)  Light -headedness (temporary)  Pain at injection site (several days)  Decreased blood pressure (temporary)  Weakness in arm/leg (temporary)  Pressure sensation in back/neck (temporary)  Call if you experience:  Fever/chills associated with headache or increased back/neck pain.  Headache worsened by an upright position.  New onset weakness or numbness of an extremity below the injection site  Hives or difficulty breathing (go to the emergency room)  Inflammation or drainage at the infection site  Severe back/neck pain  Any new symptoms which are concerning to you  Please note:  Although the local anesthetic  injected can often make your back or neck feel good for several hours after the injection, the pain will likely return.  It takes 3-7 days for steroids to work in the epidural space.  You may not notice any pain relief for at least that one week.  If effective, we will often do a series of three injections spaced 3-6 weeks apart to maximally  decrease your pain.  After the initial series, we generally will wait several months before considering a repeat injection of the same type.

## 2019-11-19 NOTE — Progress Notes (Signed)
Lumbar spine MRI does show a bulging disc impinging the left L5 nerve root which matches your symptoms.  We will discuss this during follow-up visit on the 26th.

## 2019-11-19 NOTE — Progress Notes (Signed)
I, Anna Tran, LAT, ATC, am serving as scribe for Dr. [name].  Anna Tran is a 44 y.o. female who presents to [place] at [facility] today for f/u of low back pain and L leg/lateral calf pain and weakness in her L ankle DFs and great toe extensors.  She is here for L-spine MRI review.  She was last seen by [name] on 11/06/19.  Since her last visit, pt notes that her pain has improved w/ the medications she's been taking.  She just finished the prednisone last night and is taking the gabapentin.  She con't to note weakness in her L ankle DFs and L great toe extnesors.  Diagnostic testing: L-spine XR- 05/06/16; L-spine MRI- 03/06/16 and 11/17/19   Pertinent review of systems: No fevers or chills  Relevant historical information: History of partial discectomy L5-S1 Dr. [name]   Exam:  BP (!) 144/68 (BP Location: Right Arm, Patient Position: Sitting, Cuff Size: Normal)   Pulse 80   Ht 5' 5"  (1.651 m)   Wt 156 lb 9.6 oz (71 kg)   SpO2 98%   BMI 26.06 kg/m  General: Well Developed, well nourished, and in no acute distress.   MSK: L-spine nontender.  Decreased lumbar motion. Lower extremity left foot dorsiflexion strength 4+/5. Left great toe dorsiflexion strength 4/5 Pulses cap refill and sensation are intact   Lab and Radiology Results No results found for this or any previous visit (from the past 72 hour(s)). MR Lumbar Spine Wo Contrast  Result Date: 11/18/2019 CLINICAL DATA:  Initial evaluation for progressive lower back pain. EXAM: MRI LUMBAR SPINE WITHOUT CONTRAST TECHNIQUE: Multiplanar, multisequence MR imaging of the lumbar spine was performed. No intravenous contrast was administered. COMPARISON:  Prior MRI from 03/06/2016. FINDINGS: Segmentation: Standard. Lowest well-formed disc space labeled the L5-S1 level. Alignment: Trace 2 mm anterolisthesis of L5 on S1, chronic and facet mediated. Alignment otherwise normal preservation of the normal lumbar  lordosis. Vertebrae: Vertebral body height maintained without evidence for acute or chronic fracture. Bone marrow signal intensity within normal limits. Few scattered benign hemangiomata noted. No worrisome osseous lesions. Mild reactive endplate changes present about the L4-5 and L5-S1 interspaces. No other abnormal marrow edema. Conus medullaris and cauda equina: Conus extends to the L1 level. Conus and cauda equina appear normal. Paraspinal and other soft tissues: Chronic postoperative scarring present within the lower posterior paraspinous soft tissues. Paraspinous soft tissues demonstrate no acute finding. Small simple cyst noted at the lower pole the right kidney. Visualized visceral structures otherwise unremarkable. Disc levels: L1-2:  Unremarkable. L2-3:  Unremarkable. L3-4:  Unremarkable. L4-5: Trace anterolisthesis. Mild diffuse disc bulge with disc desiccation and intervertebral disc space narrowing. Superimposed broad-based left subarticular disc protrusion extends into the left lateral recess, impinging upon the descending left L5 nerve root. Mild bilateral facet hypertrophy. Resultant mild canal with severe left lateral recess stenosis. Foramina remain patent. L5-S1: Diffuse disc bulge with disc desiccation. Postoperative changes from prior left laminotomy with micro mastectomy. No appreciable residual or recurrent disc herniation. No significant canal or lateral recess stenosis. Foramina remain patent. IMPRESSION: 1. Broad left subarticular disc protrusion at L4-5, impinging upon the descending left L5 nerve root in the left lateral recess. 2. Sequelae of prior left laminotomy with micro mastectomy at L5-S1 without residual or recurrent disc herniation. Electronically Signed   By: Jeannine Boga M.D.   On: 11/18/2019 02:27   I, [name], personally (independently) visualized and performed the interpretation of  the images attached in this note.     Assessment and Plan: 44 y.o. female  with left L5 lumbar radiculopathy.  Patient does have trace weakness foot and great toe dorsiflexion.  Discussed options.  Plan for epidural steroid injection.  Cautioned against progression of neurologic symptoms.  If not improving would consider check back with Dr. [name] neurosurgery for surgical consultation.  She would like to avoid surgery if possible which I think is reasonable at this point.  Additionally discussed timing of her second COVID-19 vaccine.  Recommend not doing the COVID-19 vaccine just before or just after an epidural steroid injection.  Recheck back as needed.  Keep me updated on symptoms.   PDMP not reviewed this encounter. Orders Placed This Encounter  Procedures  . DG INJECT DIAG/THERA/INC NEEDLE/CATH/PLC EPI/LUMB/SAC W/IMG    LUMB EPI #1 VS NRB PER ORDER LEFT L4-5 BCBS 156 LBS PACS (11/17/19) NO THINS/OTC*SCREENED*1st Covid vaccine 10/19/19-pt aware/rlc    Standing Status:   Future    Standing Expiration Date:   01/18/2021    Order Specific Question:   Reason for Exam (SYMPTOM  OR DIAGNOSIS REQUIRED)    Answer:   Left L4-5 for L5 radiculopathy. Transforaminal vs interlaminar per radiology discretion    Order Specific Question:   Is the patient pregnant?    Answer:   No    Order Specific Question:   Preferred Imaging Location?    Answer:   [id number]    Order Specific Question:   Radiology Contrast Protocol - do NOT remove file path    Answer:   \\[email or web address]   No orders of the defined types were placed in this encounter.    Discussed warning signs or symptoms. Please see discharge instructions. Patient expresses understanding.   The above documentation has been reviewed and is accurate and complete [name]   Total encounter time 20 minutes including charting time date of service. MRI findings, epidural steroid injection, COVID-19 vaccine timing

## 2019-11-27 ENCOUNTER — Ambulatory Visit
Admission: RE | Admit: 2019-11-27 | Discharge: 2019-11-27 | Disposition: A | Discharge status: Home / Self Care | Payer: BC Managed Care – PPO | Source: Ambulatory Visit | Attending: Family Medicine | Admitting: Family Medicine

## 2019-11-27 ENCOUNTER — Other Ambulatory Visit: Payer: Self-pay

## 2019-11-27 ENCOUNTER — Other Ambulatory Visit: Payer: Self-pay | Admitting: Family Medicine

## 2019-11-27 DIAGNOSIS — M5416 Radiculopathy, lumbar region: Secondary | ICD-10-CM

## 2019-11-27 IMAGING — XA DG EPIDURAL NERVE ROOT
2 series · 2 of 2 positions shown · non-contrast
Comparison: none

CLINICAL DATA: Lumbosacral spondylosis without myelopathy. Left L5
radiculopathy. Left subarticular disc extrusion with caudal
migration at L4-5.

[Series 1: ortho standard · 1 of 1 slices shown (1 of 2)]
[im 1/1]
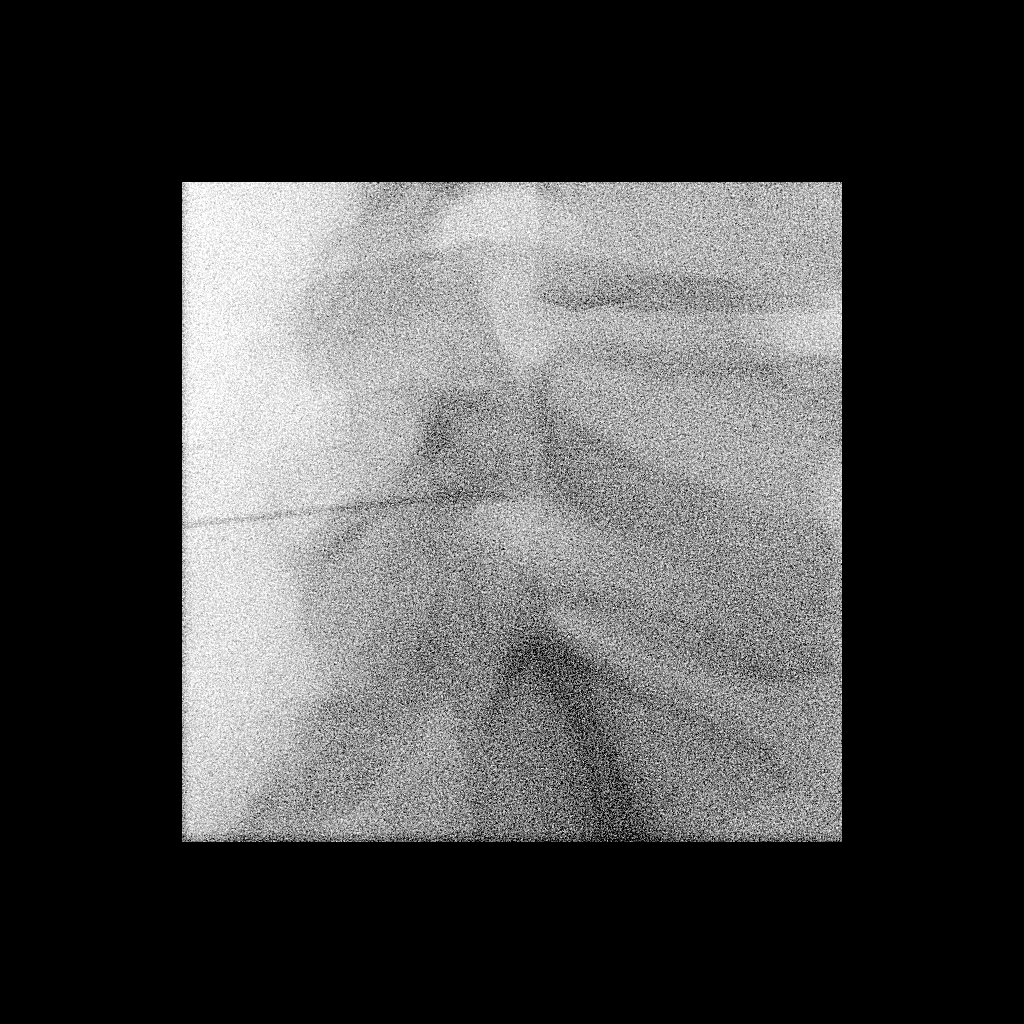

[Series 2: ortho standard · 1 of 1 slices shown (2 of 2)]
[im 1/1]
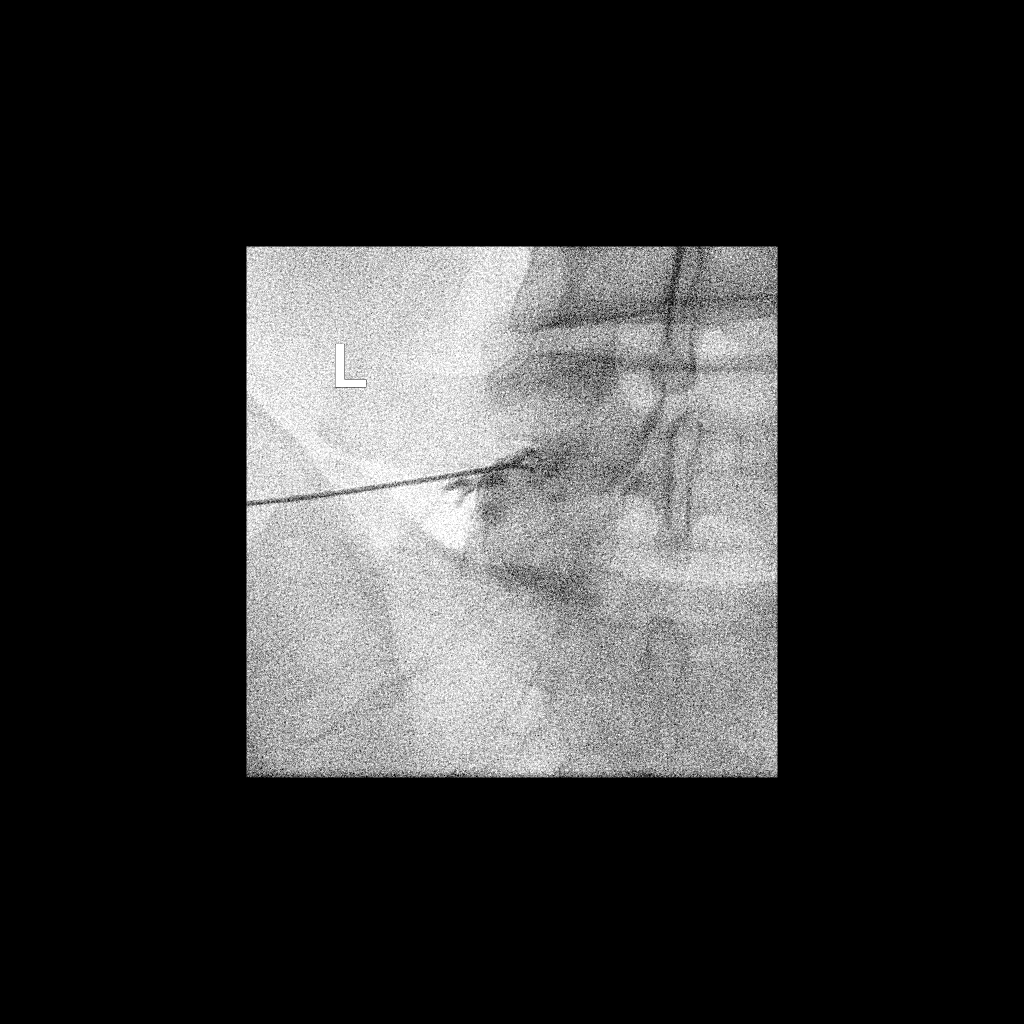

[2 of 2 positions shown; findings below may reference images not displayed]

EXAM:
EPIDURAL/NERVE ROOT

FLUOROSCOPY TIME:  Fluoroscopy Time: 20 seconds

Radiation Exposure Index: 14.44 microGray*m^2

PROCEDURE:
The procedure, risks, benefits, and alternatives were explained to
the patient. Questions regarding the procedure were encouraged and
answered. The patient understands and consents to the procedure.

LEFT L5 NERVE ROOT BLOCK AND TRANSFORAMINAL EPIDURAL: A posterior
oblique approach was taken to the intervertebral foramen on the left
at L5-S1 using a curved 3.5 inch 22 gauge spinal needle. Injection
of Isovue-M 200 outlined the left L5 nerve root and showed good
epidural spread. No vascular opacification is seen. 120 mg of
Depo-Medrol mixed with 1.5 mL of 1% lidocaine were instilled. The
procedure was well-tolerated, and the patient was discharged thirty
minutes following the injection in good condition.

COMPLICATIONS:
None
IMPRESSION: Technically successful injection consisting of a left L5 nerve root
block and transforaminal epidural.

## 2019-11-27 MED ORDER — METHYLPREDNISOLONE ACETATE 40 MG/ML INJ SUSP (RADIOLOG
120.0000 mg | Freq: Once | INTRAMUSCULAR | Status: AC
  Administered 2019-11-27: 120 mg via EPIDURAL

## 2019-11-27 MED ORDER — IOPAMIDOL (ISOVUE-M 200) INJECTION 41%
1.0000 mL | Freq: Once | INTRAMUSCULAR | Status: AC
  Administered 2019-11-27: 1 mL via EPIDURAL

## 2019-11-27 NOTE — Discharge Instructions (Signed)

## 2019-12-06 ENCOUNTER — Encounter: Payer: Self-pay | Admitting: Family Medicine

## 2022-06-23 ENCOUNTER — Ambulatory Visit: Payer: BC Managed Care – PPO | Admitting: Family Medicine

## 2022-06-23 ENCOUNTER — Encounter: Payer: Self-pay | Admitting: Family Medicine

## 2022-06-23 VITALS — BP 120/80 | HR 81 | Temp 98.5°F | Ht 65.0 in | Wt 155.4 lb

## 2022-06-23 DIAGNOSIS — M545 Low back pain, unspecified: Secondary | ICD-10-CM

## 2022-06-23 DIAGNOSIS — Z23 Encounter for immunization: Secondary | ICD-10-CM | POA: Diagnosis not present

## 2022-06-23 DIAGNOSIS — Z1159 Encounter for screening for other viral diseases: Secondary | ICD-10-CM | POA: Diagnosis not present

## 2022-06-23 DIAGNOSIS — H60549 Acute eczematoid otitis externa, unspecified ear: Secondary | ICD-10-CM | POA: Diagnosis not present

## 2022-06-23 DIAGNOSIS — M25531 Pain in right wrist: Secondary | ICD-10-CM | POA: Diagnosis not present

## 2022-06-23 DIAGNOSIS — Z0000 Encounter for general adult medical examination without abnormal findings: Secondary | ICD-10-CM

## 2022-06-23 DIAGNOSIS — M6289 Other specified disorders of muscle: Secondary | ICD-10-CM | POA: Insufficient documentation

## 2022-06-23 DIAGNOSIS — M25532 Pain in left wrist: Secondary | ICD-10-CM

## 2022-06-23 LAB — COMPREHENSIVE METABOLIC PANEL
ALT: 17 U/L (ref 0–35)
AST: 18 U/L (ref 0–37)
Albumin: 4.4 g/dL (ref 3.5–5.2)
Alkaline Phosphatase: 59 U/L (ref 39–117)
BUN: 9 mg/dL (ref 6–23)
CO2: 30 mEq/L (ref 19–32)
Calcium: 9.1 mg/dL (ref 8.4–10.5)
Chloride: 101 mEq/L (ref 96–112)
Creatinine, Ser: 0.71 mg/dL (ref 0.40–1.20)
GFR: 101.49 mL/min (ref >60.00)
Glucose, Bld: 84 mg/dL (ref 70–99)
Potassium: 4.4 mEq/L (ref 3.5–5.1)
Sodium: 136 mEq/L (ref 135–145)
Total Bilirubin: 0.7 mg/dL (ref 0.2–1.2)
Total Protein: 7.3 g/dL (ref 6.0–8.3)

## 2022-06-23 LAB — CBC WITH DIFFERENTIAL/PLATELET
Basophils Absolute: 0 10*3/uL (ref 0.0–0.1)
Basophils Relative: 0.3 % (ref 0.0–3.0)
Eosinophils Absolute: 0.2 10*3/uL (ref 0.0–0.7)
Eosinophils Relative: 3 % (ref 0.0–5.0)
HCT: 43.5 % (ref 36.0–46.0)
Hemoglobin: 14.8 g/dL (ref 12.0–15.0)
Lymphocytes Relative: 35.3 % (ref 12.0–46.0)
Lymphs Abs: 2 10*3/uL (ref 0.7–4.0)
MCHC: 34.1 g/dL (ref 30.0–36.0)
MCV: 88.4 fl (ref 78.0–100.0)
Monocytes Absolute: 0.4 10*3/uL (ref 0.1–1.0)
Monocytes Relative: 6.3 % (ref 3.0–12.0)
Neutro Abs: 3.1 10*3/uL (ref 1.4–7.7)
Neutrophils Relative %: 55.1 % (ref 43.0–77.0)
Platelets: 294 10*3/uL (ref 150.0–400.0)
RBC: 4.92 Mil/uL (ref 3.87–5.11)
RDW: 12.6 % (ref 11.5–15.5)
WBC: 5.6 10*3/uL (ref 4.0–10.5)

## 2022-06-23 LAB — LIPID PANEL
Cholesterol: 222 mg/dL — ABNORMAL HIGH (ref 0–200)
HDL: 60.5 mg/dL (ref >39.00)
LDL Cholesterol: 149 mg/dL — ABNORMAL HIGH (ref 0–99)
NonHDL: 161.16
Total CHOL/HDL Ratio: 4
Triglycerides: 61 mg/dL (ref 0.0–149.0)
VLDL: 12.2 mg/dL (ref 0.0–40.0)

## 2022-06-23 NOTE — Patient Instructions (Signed)
Please return in 12 months for your annual complete physical; please come fasting.   I will release your lab results to you on your MyChart account with further instructions. You may see the results before I do, but when I review them I will send you a message with my report or have my assistant call you if things need to be discussed. Please reply to my message with any questions. Thank you!   It was a pleasure seeing you today! Thank you for choosing [place] to meet your healthcare needs! I truly look forward to working with you. If you have any questions or concerns, please send me a message via Mychart or call the office at [phone].   Please do these things to maintain good health!  Exercise at least 30-45 minutes a day,  4-5 days a week.  Eat a low-fat diet with lots of fruits and vegetables, up to 7-9 servings per day. Drink plenty of water daily. Try to drink 8 8oz glasses per day. Seatbelts can save your life. Always wear your seatbelt. Place Smoke Detectors on every level of your home and check batteries every year. Schedule an appointment with an eye doctor for an eye exam every 1-2 years Safe sex - use condoms to protect yourself from STDs if you could be exposed to these types of infections. Use birth control if you do not want to become pregnant and are sexually active. Avoid heavy alcohol use. If you drink, keep it to less than 2 drinks/day and not every day. Health Care Power of Attorney.  Choose someone you trust that could speak for you if you became unable to speak for yourself. Depression is common in our stressful world.If you're feeling down or losing interest in things you normally enjoy, please come in for a visit. If anyone is threatening or hurting you, please get help. Physical or Emotional Violence is never OK.

## 2022-06-23 NOTE — Progress Notes (Signed)
Subjective  Chief Complaint  Patient presents with   Establish Care    Pt is fasting today if blood work is need    HPI: Anna Tran is a 46 y.o. female who presents to [facility] [place] today for a Female Wellness Visit.  New patient today/ reviewed available old records and lab work. Wellness Visit: annual visit with health maintenance review and exam without Pap  HM: see GYN; Dr. Ernestina Penna: She had a female wellness exam in June of this year.  I reviewed lab results, normal Pap with negative HPV testing, normal mammogram.  She has had her flu shot but is eligible for Tdap.  She lives a healthy lifestyle, married, Water engineer at [employer].  Healthy lifestyle Medical history significant for herniated discectomy, lumbar and high tone pelvic floor dysfunction with some bowel dysfunction.  Currently undergoing physical therapy for that.  He has seen gastroenterology in the past with a normal colonoscopy several years back.  No family history of colon cancer.  No IBS by report. Also complains of itchy external ear canals intermittently and intermittent rest pain.  She does have history of wrist fractures as a child.  Assessment  1. Annual physical exam   2. Need for hepatitis C screening test   3. Eczema of external auditory canal   4. Bilateral wrist pain   5. Episodic low back pain   6. Need for Tdap vaccination   7. 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 Included topics are cancer screening recommendations, ways to keep healthy (see AVS) including dietary and exercise recommendations, regular eye and dental care, use of seat belts, and avoidance of moderate alcohol use and tobacco use.  Screens are current BMI: discussed patient's BMI and encouraged positive lifestyle modifications to help get to or maintain a target BMI. HM  needs and immunizations were addressed and ordered. See below for orders. See HM and immunization section for updates.  Tdap today Routine labs and screening tests ordered including cmp, cbc and lipids where appropriate. Discussed recommendations regarding Vit D and calcium supplementation (see AVS) Recommend cortisone cream sparingly to external auditory canals as needed Possibly posttraumatic osteoarthritis or ligamentous injury causing intermittent pain.  No red flag symptoms.  Follow-up as needed  Follow up: 12 months for complete physical  Orders Placed This Encounter  Procedures   Tdap vaccine greater than or equal to 7yo IM   CBC with Differential/Platelet   Comprehensive metabolic panel   Lipid panel   Hepatitis C antibody   No orders of the defined types were placed in this encounter.     Body mass index is 25.86 kg/m. 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 Allergies: Patient is allergic to amoxicillin and sulfa antibiotics. Past Medical History Patient  has a past medical history of Chronic back  pain, History of panic attacks, Hyperlipidemia, and Slow urinary stream. Past Surgical History Patient  has a past surgical history that includes Cystectomy (2008); Wisdom tooth extraction; Cesarean section (2010); Colonoscopy; and Lumbar laminectomy/decompression microdiscectomy (Left, 05/06/2016). Family History: Patient family history includes Asthma in her father; Cancer in her paternal grandmother; Depression in her mother; Hyperlipidemia in her father and mother; Hypertension in her father. Social History:  Patient  reports that she has never smoked. She has never used smokeless tobacco. She reports current alcohol use. She reports that she does not use drugs.  Review of Systems: Constitutional: negative for fever or malaise Ophthalmic: negative for photophobia, double vision or loss of vision Cardiovascular: negative for chest pain, dyspnea on exertion, or new LE swelling Respiratory: negative for SOB or persistent cough Gastrointestinal: negative for abdominal pain, change in bowel habits or melena Genitourinary: negative for dysuria or gross hematuria, no abnormal uterine bleeding or disharge Musculoskeletal: negative for new gait disturbance or muscular weakness Integumentary: negative for new or persistent rashes, no breast lumps Neurological: negative for TIA or stroke symptoms Psychiatric: negative for SI or delusions Allergic/Immunologic: negative for hives  Patient Care Team    Relationship Specialty Notifications Start End  Leamon Arnt, MD PCP - General Family Medicine  06/23/22   Rexene Agent, MD Consulting Physician Colon and Rectal Surgery  06/23/22   Aloha Gell, MD Consulting Physician Obstetrics and Gynecology  06/23/22   Gregor Hams, MD Consulting Physician Family Medicine  06/23/22   Newman Pies, MD Consulting Physician Neurosurgery  06/23/22     Objective  Vitals: BP 120/80   Pulse 81   Temp 98.5 F (36.9 C)   Ht 5\' 5"  (1.651 m)   Wt 155  lb 6.4 oz (70.5 kg)   SpO2 99%   BMI 25.86 kg/m  General:  Well developed, well nourished, no acute distress  Psych:  Alert and orientedx3,normal mood and affect HEENT:  Normocephalic, atraumatic, non-icteric sclera, supple neck without adenopathy, mass or thyromegaly Cardiovascular:  Normal S1, S2, RRR without gallop, rub or murmur Respiratory:  Good breath sounds bilaterally, CTAB with normal respiratory effort Gastrointestinal: normal bowel sounds, soft, non-tender, no noted masses. No HSM MSK: no deformities, contusions. Joints are without erythema or swelling.  Normal wrist exam bilaterally Skin:  Warm, no rashes or suspicious lesions noted, flaking bilateral external auditory canals Neurologic:    Mental status is normal. CN 2-11 are normal. Gross motor and sensory exams are normal. Normal gait. No tremor  Commons side effects, risks, benefits, and alternatives for medications and treatment plan prescribed today were discussed, and the patient expressed understanding of the given instructions. Patient is instructed to call or message via MyChart if he/she has any questions or concerns regarding our treatment plan. No barriers to understanding were identified. We discussed Red Flag symptoms and signs in detail. Patient expressed understanding regarding what to do in case of urgent or emergency type symptoms.  Medication list was reconciled, printed and provided to the patient in AVS. Patient instructions and summary information was reviewed with the patient  as documented in the AVS. This note was prepared with assistance of Dragon voice recognition software. Occasional wrong-word or sound-a-like substitutions may have occurred due to the inherent limitations of voice recognition software

## 2022-06-24 LAB — HEPATITIS C ANTIBODY: Hepatitis C Ab: NONREACTIVE

## 2022-08-01 ENCOUNTER — Encounter: Payer: Self-pay | Admitting: Family Medicine

## 2022-10-28 NOTE — Progress Notes (Signed)
I, Stevenson ClinchBrandy Coleman, CMA acting as a Neurosurgeonscribe for Clementeen GrahamEvan Lincoln Ginley, MD.  Subjective:    CC: Low back pain  HPI: Patient is a 47 year old female presenting with a flare of her LBP.  Patient was previously seen by Dr. Denyse Amassorey back in 2021 for lumbar radicular pain and last had ESI on 11/27/2019.  Today, patient reports flare-up of lower back sx. Sx flaring up since Jan 2024, no nerve-type sx.  Patient locates pain to bilat lower back. Tightness in the lower back. Occasional cramps and tingling s/p surgery, sx are intermittent.   Radiating pain: yes - hx of left L5-S1 discectomy LE numbness/tingling: yes - hx of left L5-S1 discectomy LE weakness: yes - hx of left L5-S1 discectomy Aggravates: prolonged sitting or laying, worse in the mornings.  Treatments tried: IBU, Naproxen, muscle relaxers.  Dx imaging: 11/17/2019 L-spine MRI  05/06/2016 L-spine x-Diego  03/06/2016 L-spine MRI  03/04/2016 L-spine x-Apostol  Pertinent review of Systems: No fevers or chills  Relevant historical information: History of lumbar radiculopathy.  History of pelvic floor dysfunction.   Objective:    Vitals:   10/29/22 0849  BP: 122/82  Pulse: 85  SpO2: 97%   General: Well Developed, well nourished, and in no acute distress.   MSK: L-spine: Normal appearing Nontender palpation spinal midline. Minimally tender palpation bilateral lumbar paraspinal musculature. Normal lumbar motion. Lower extremity is intact.  Lab and Radiology Results  Narrative & Impression  CLINICAL DATA:  Initial evaluation for progressive lower back pain.   EXAM: MRI LUMBAR SPINE WITHOUT CONTRAST   TECHNIQUE: Multiplanar, multisequence MR imaging of the lumbar spine was performed. No intravenous contrast was administered.   COMPARISON:  Prior MRI from 03/06/2016.   FINDINGS: Segmentation: Standard. Lowest well-formed disc space labeled the L5-S1 level.   Alignment: Trace 2 mm anterolisthesis of L5 on S1, chronic and  facet mediated. Alignment otherwise normal preservation of the normal lumbar lordosis.   Vertebrae: Vertebral body height maintained without evidence for acute or chronic fracture. Bone marrow signal intensity within normal limits. Few scattered benign hemangiomata noted. No worrisome osseous lesions. Mild reactive endplate changes present about the L4-5 and L5-S1 interspaces. No other abnormal marrow edema.   Conus medullaris and cauda equina: Conus extends to the L1 level. Conus and cauda equina appear normal.   Paraspinal and other soft tissues: Chronic postoperative scarring present within the lower posterior paraspinous soft tissues. Paraspinous soft tissues demonstrate no acute finding. Small simple cyst noted at the lower pole the right kidney. Visualized visceral structures otherwise unremarkable.   Disc levels:   L1-2:  Unremarkable.   L2-3:  Unremarkable.   L3-4:  Unremarkable.   L4-5: Trace anterolisthesis. Mild diffuse disc bulge with disc desiccation and intervertebral disc space narrowing. Superimposed broad-based left subarticular disc protrusion extends into the left lateral recess, impinging upon the descending left L5 nerve root. Mild bilateral facet hypertrophy. Resultant mild canal with severe left lateral recess stenosis. Foramina remain patent.   L5-S1: Diffuse disc bulge with disc desiccation. Postoperative changes from prior left laminotomy with micro mastectomy. No appreciable residual or recurrent disc herniation. No significant canal or lateral recess stenosis. Foramina remain patent.   IMPRESSION: 1. Broad left subarticular disc protrusion at L4-5, impinging upon the descending left L5 nerve root in the left lateral recess. 2. Sequelae of prior left laminotomy with micro mastectomy at L5-S1 without residual or recurrent disc herniation.     Electronically Signed   By: Janell QuietBenjamin  McClintock M.D.  On: 11/18/2019 02:27   I, Clementeen GrahamEvan Lucah Petta,  personally (independently) visualized and performed the interpretation of the images attached in this note.   X-Loja images lumbar spine obtained today personally and independently interpreted DDD most significantly L5-S1.  Moderately L4-5.  No acute fractures are visible. Await formal radiology review  Impression and Recommendations:    Assessment and Plan: 47 y.o. female with chronic low back pain.  This is an acute exacerbation of a chronic problem.  She was seen for similar issue in 2021 and treated ultimately with an epidural steroid injection.  Today symptoms are little different.  Her dominant issue is axial back pain and not lumbar radiculopathy.  She has been trying over-the-counter medications such as Tylenol and ibuprofen with not much benefit.  She has some leftover methocarbamol that has helped a little.  However she thinks is so old that the effectiveness of this medication may be lapsed.  Plan today for new updated lumbar spine x-Riemenschneider, physical therapy referral to treat what I think is muscle dysfunction lumbar spine paraspinal musculature.  Additionally will use new muscle relaxer tizanidine as this may be helpful.  Additionally short course of prednisone as that could be helpful.  Recommend also heating pad and TENS unit.  However her main treatment will be physical therapy.   PDMP not reviewed this encounter. Orders Placed This Encounter  Procedures   DG Lumbar Spine 2-3 Views    Standing Status:   Future    Number of Occurrences:   1    Standing Expiration Date:   11/28/2022    Order Specific Question:   Reason for Exam (SYMPTOM  OR DIAGNOSIS REQUIRED)    Answer:   low back pain    Order Specific Question:   Preferred imaging location?    [facility]    Order Specific Question:   Is patient pregnant?    Answer:   No   Ambulatory referral to Physical Therapy    Referral Priority:   Routine    Referral Type:   Physical Medicine    Referral Reason:    Specialty Services Required    Requested Specialty:   Physical Therapy    Number of Visits Requested:   1   Meds ordered this encounter  Medications   tiZANidine (ZANAFLEX) 4 MG tablet    Sig: Take 1 tablet (4 mg total) by mouth every 8 (eight) hours as needed for muscle spasms.    Dispense:  30 tablet    Refill:  1   predniSONE (DELTASONE) 50 MG tablet    Sig: Take 1 tablet (50 mg total) by mouth daily with breakfast for 5 days.    Dispense:  5 tablet    Refill:  0    Discussed warning signs or symptoms. Please see discharge instructions. Patient expresses understanding.   The above documentation has been reviewed and is accurate and complete Clementeen GrahamEvan Amberlie Gaillard, M.D.

## 2022-10-29 ENCOUNTER — Ambulatory Visit (INDEPENDENT_AMBULATORY_CARE_PROVIDER_SITE_OTHER): Payer: BC Managed Care – PPO

## 2022-10-29 ENCOUNTER — Ambulatory Visit: Payer: BC Managed Care – PPO | Admitting: Family Medicine

## 2022-10-29 ENCOUNTER — Encounter: Payer: Self-pay | Admitting: Family Medicine

## 2022-10-29 VITALS — BP 122/82 | HR 85 | Ht 65.0 in | Wt 156.0 lb

## 2022-10-29 DIAGNOSIS — M545 Low back pain, unspecified: Secondary | ICD-10-CM

## 2022-10-29 DIAGNOSIS — G8929 Other chronic pain: Secondary | ICD-10-CM | POA: Diagnosis not present

## 2022-10-29 MED ORDER — PREDNISONE 50 MG PO TABS: 50.0000 mg | ORAL_TABLET | Freq: Every day | ORAL | 0 refills | Status: AC

## 2022-10-29 MED ORDER — TIZANIDINE HCL 4 MG PO TABS: 4.0000 mg | ORAL_TABLET | Freq: Three times a day (TID) | ORAL | 1 refills | Status: DC | PRN

## 2022-10-29 NOTE — Patient Instructions (Signed)
Thank you for coming in today.  ° °Please get an Xray today before you leave  °

## 2022-11-01 NOTE — Progress Notes (Signed)
Lumbar spine x-Stay looks normal to radiology.

## 2022-11-08 ENCOUNTER — Encounter: Payer: Self-pay | Admitting: Family Medicine

## 2022-11-12 ENCOUNTER — Other Ambulatory Visit: Payer: Self-pay

## 2022-11-12 DIAGNOSIS — M545 Low back pain, unspecified: Secondary | ICD-10-CM

## 2022-11-17 ENCOUNTER — Ambulatory Visit: Payer: BC Managed Care – PPO | Admitting: Physical Therapy

## 2022-12-23 NOTE — Progress Notes (Signed)
Rubin Payor, PhD, LAT, ATC acting as a scribe for Clementeen Graham, MD.  Anna Tran is a 47 y.o. female who presents to [facility] at [facility] today for 8-wk f/u LBP. Hx of left L5-S1 discectomy Pt was last seen by Dr. Denyse Amass on 10/29/22 and was advised to use a heating pad, TENS unit, was prescribed tizanidine, and referred to [facility] PT. Today, pt reports back pain is improved. She was having 1-2 PT visits/ wk. Now she is having pain in her L buttock w/ radiating pain along the posterior aspect of her L leg. She notes pain is intermittent.   Dx imaging: 10/29/22 L-spine XR 11/17/2019 L-spine MRI             05/06/2016 L-spine x-[facility]             03/06/2016 L-spine MRI             03/04/2016 L-spine x-[facility]  Pertinent review of systems: No fevers or chills  Relevant historical information: History of severe lumbar radiculopathy requiring surgery in 2017.  She had a recurrence of lumbar radiculopathy in 2021 requiring an epidural steroid injection that was effective.   Exam:  BP 132/84   Pulse 83   Ht 5\' 5"  (1.651 m)   Wt 158 lb (71.7 kg)   SpO2 98%   BMI 26.29 kg/m  General: Well Developed, well nourished, and in no acute distress.   MSK: L-spine nontender to palpation.  Normal lumbar motion. Lower extremity strength is intact. Reflexes are intact. Negative straight leg raise test.  Left hip normal-appearing Normal motion. Mildly tender palpation at piriformis. Unable to reproduce radicular pain with resisted strength testing.  Strength is mildly reduced 4/5 left hip external rotation.    Lab and Radiology Results   DG Lumbar Spine 2-3 Views  Result Date: 11/01/2022 CLINICAL DATA:  Low back pain EXAM: LUMBAR SPINE - 2-3 VIEW COMPARISON:  MRI lumbar spine dated 11/17/2019 FINDINGS: Five lumbar-type vertebral bodies. Normal lumbar lordosis. No evidence of fracture or dislocation. Vertebral body heights and intervertebral disc spaces are maintained. Visualized  bony pelvis appears intact. IMPRESSION: Negative. Electronically Signed   By: Charline Bills M.D.   On: 11/01/2022 00:30   I, Clementeen Graham, personally (independently) visualized and performed the interpretation of the images attached in this note.      Assessment and Plan: 47 y.o. female with pain radiating down left leg.  This occurs in the setting of a recent back pain episode that is currently being treated with PT.  Current symptoms could be left S1 radiculopathy or piriformis syndrome.  She is working with physical therapy who thinks is piriformis syndrome.  Plan to continue PT for this issue including dry needling.  I have prescribed prednisone and gabapentin which she can start taking if her symptoms are worsening.  The next step if worsening or not better on my end would be a trial of an epidural steroid injection based on her MRI from 2021.  If this does not work well enough she really should have a repeat MRI at that time.  She can let me know how she feels and we can react with the above plan if needed.   PDMP not reviewed this encounter. Orders Placed This Encounter  Procedures   Ambulatory referral to Physical Therapy    Referral Priority:   Routine    Referral Type:   Physical Medicine    Referral Reason:   Specialty  Services Required    Requested Specialty:   Physical Therapy    Number of Visits Requested:   1   Meds ordered this encounter  Medications   predniSONE (DELTASONE) 50 MG tablet    Sig: Take 1 tablet (50 mg total) by mouth daily.    Dispense:  5 tablet    Refill:  0   gabapentin (NEURONTIN) 300 MG capsule    Sig: Take 1 capsule (300 mg total) by mouth 3 (three) times daily as needed.    Dispense:  90 capsule    Refill:  3     Discussed warning signs or symptoms. Please see discharge instructions. Patient expresses understanding.   The above documentation has been reviewed and is accurate and complete Clementeen Graham, M.D.

## 2022-12-24 ENCOUNTER — Ambulatory Visit: Payer: BC Managed Care – PPO | Admitting: Family Medicine

## 2022-12-24 VITALS — BP 132/84 | HR 83 | Ht 65.0 in | Wt 158.0 lb

## 2022-12-24 DIAGNOSIS — G5702 Lesion of sciatic nerve, left lower limb: Secondary | ICD-10-CM | POA: Diagnosis not present

## 2022-12-24 DIAGNOSIS — M79605 Pain in left leg: Secondary | ICD-10-CM | POA: Diagnosis not present

## 2022-12-24 DIAGNOSIS — M545 Low back pain, unspecified: Secondary | ICD-10-CM

## 2022-12-24 MED ORDER — GABAPENTIN 300 MG PO CAPS: 300.0000 mg | ORAL_CAPSULE | Freq: Three times a day (TID) | ORAL | 3 refills | Status: DC | PRN

## 2022-12-24 MED ORDER — PREDNISONE 50 MG PO TABS: 50.0000 mg | ORAL_TABLET | Freq: Every day | ORAL | 0 refills | Status: DC

## 2022-12-24 NOTE — Patient Instructions (Addendum)
Thank you for coming in today.   Continue PT.   If you get worse backup plan for Prednisone and gabapentin is ready.  I can order a repeat epidural with a phone call or mychart message.   Let me know how this goes.  Give it a month.

## 2022-12-28 LAB — HM PAP SMEAR

## 2022-12-28 LAB — HM MAMMOGRAPHY

## 2023-01-13 ENCOUNTER — Encounter: Payer: Self-pay | Admitting: Family Medicine

## 2023-01-19 ENCOUNTER — Other Ambulatory Visit: Payer: Self-pay

## 2023-01-19 DIAGNOSIS — M545 Low back pain, unspecified: Secondary | ICD-10-CM

## 2023-01-25 NOTE — Discharge Instructions (Signed)

## 2023-01-26 ENCOUNTER — Other Ambulatory Visit: Payer: Self-pay | Admitting: Family Medicine

## 2023-01-26 ENCOUNTER — Ambulatory Visit
Admission: RE | Admit: 2023-01-26 | Discharge: 2023-01-26 | Disposition: A | Discharge status: Home / Self Care | Payer: BC Managed Care – PPO | Source: Ambulatory Visit | Attending: Family Medicine | Admitting: Family Medicine

## 2023-01-26 DIAGNOSIS — M545 Low back pain, unspecified: Secondary | ICD-10-CM

## 2023-01-26 MED ORDER — IOPAMIDOL (ISOVUE-M 200) INJECTION 41%
1.0000 mL | Freq: Once | INTRAMUSCULAR | Status: AC
  Administered 2023-01-26: 1 mL via EPIDURAL

## 2023-01-26 MED ORDER — METHYLPREDNISOLONE ACETATE 40 MG/ML INJ SUSP (RADIOLOG
80.0000 mg | Freq: Once | INTRAMUSCULAR | Status: AC
  Administered 2023-01-26: 80 mg via EPIDURAL

## 2023-06-22 LAB — HM MAMMOGRAPHY

## 2023-08-31 ENCOUNTER — Ambulatory Visit (INDEPENDENT_AMBULATORY_CARE_PROVIDER_SITE_OTHER): Payer: 59 | Admitting: Family Medicine

## 2023-08-31 ENCOUNTER — Encounter: Payer: Self-pay | Admitting: Family Medicine

## 2023-08-31 ENCOUNTER — Encounter: Payer: BC Managed Care – PPO | Admitting: Family Medicine

## 2023-08-31 VITALS — BP 143/92 | HR 82 | Temp 98.1°F | Ht 65.0 in | Wt 156.4 lb

## 2023-08-31 DIAGNOSIS — Z1211 Encounter for screening for malignant neoplasm of colon: Secondary | ICD-10-CM | POA: Diagnosis not present

## 2023-08-31 DIAGNOSIS — F40243 Fear of flying: Secondary | ICD-10-CM | POA: Diagnosis not present

## 2023-08-31 DIAGNOSIS — Z0000 Encounter for general adult medical examination without abnormal findings: Secondary | ICD-10-CM | POA: Diagnosis not present

## 2023-08-31 DIAGNOSIS — Z1212 Encounter for screening for malignant neoplasm of rectum: Secondary | ICD-10-CM

## 2023-08-31 DIAGNOSIS — K219 Gastro-esophageal reflux disease without esophagitis: Secondary | ICD-10-CM

## 2023-08-31 DIAGNOSIS — H60549 Acute eczematoid otitis externa, unspecified ear: Secondary | ICD-10-CM | POA: Diagnosis not present

## 2023-08-31 DIAGNOSIS — M546 Pain in thoracic spine: Secondary | ICD-10-CM | POA: Diagnosis not present

## 2023-08-31 LAB — COMPREHENSIVE METABOLIC PANEL
ALT: 16 U/L (ref 0–35)
AST: 17 U/L (ref 0–37)
Albumin: 4.5 g/dL (ref 3.5–5.2)
Alkaline Phosphatase: 61 U/L (ref 39–117)
BUN: 11 mg/dL (ref 6–23)
CO2: 26 meq/L (ref 19–32)
Calcium: 9.1 mg/dL (ref 8.4–10.5)
Chloride: 101 meq/L (ref 96–112)
Creatinine, Ser: 0.66 mg/dL (ref 0.40–1.20)
GFR: 103.84 mL/min (ref >60.00)
Glucose, Bld: 92 mg/dL (ref 70–99)
Potassium: 4 meq/L (ref 3.5–5.1)
Sodium: 136 meq/L (ref 135–145)
Total Bilirubin: 0.5 mg/dL (ref 0.2–1.2)
Total Protein: 8 g/dL (ref 6.0–8.3)

## 2023-08-31 LAB — CBC WITH DIFFERENTIAL/PLATELET
Basophils Absolute: 0 10*3/uL (ref 0.0–0.1)
Basophils Relative: 0.3 % (ref 0.0–3.0)
Eosinophils Absolute: 0.1 10*3/uL (ref 0.0–0.7)
Eosinophils Relative: 2.2 % (ref 0.0–5.0)
HCT: 44.1 % (ref 36.0–46.0)
Hemoglobin: 14.8 g/dL (ref 12.0–15.0)
Lymphocytes Relative: 34.5 % (ref 12.0–46.0)
Lymphs Abs: 2 10*3/uL (ref 0.7–4.0)
MCHC: 33.5 g/dL (ref 30.0–36.0)
MCV: 88.7 fL (ref 78.0–100.0)
Monocytes Absolute: 0.3 10*3/uL (ref 0.1–1.0)
Monocytes Relative: 5 % (ref 3.0–12.0)
Neutro Abs: 3.4 10*3/uL (ref 1.4–7.7)
Neutrophils Relative %: 58 % (ref 43.0–77.0)
Platelets: 317 10*3/uL (ref 150.0–400.0)
RBC: 4.97 Mil/uL (ref 3.87–5.11)
RDW: 12.5 % (ref 11.5–15.5)
WBC: 5.9 10*3/uL (ref 4.0–10.5)

## 2023-08-31 LAB — LIPID PANEL
Cholesterol: 199 mg/dL (ref 0–200)
HDL: 58.6 mg/dL (ref >39.00)
LDL Cholesterol: 124 mg/dL — ABNORMAL HIGH (ref 0–99)
NonHDL: 140.22
Total CHOL/HDL Ratio: 3
Triglycerides: 82 mg/dL (ref 0.0–149.0)
VLDL: 16.4 mg/dL (ref 0.0–40.0)

## 2023-08-31 MED ORDER — ALPRAZOLAM 0.5 MG PO TABS: 0.5000 mg | ORAL_TABLET | Freq: Every day | ORAL | 0 refills | Status: DC | PRN

## 2023-08-31 MED ORDER — TRIAMCINOLONE ACETONIDE 0.1 % EX CREA: 1.0000 | TOPICAL_CREAM | Freq: Two times a day (BID) | CUTANEOUS | 2 refills | Status: AC

## 2023-08-31 NOTE — Progress Notes (Signed)
 Subjective  Chief Complaint  Patient presents with   Annual Exam    HPI: Anna Tran is a 48 y.o. female who presents to [facility] [place] today for a Female Wellness Visit. She also has the concerns and/or needs as listed above in the chief complaint. These will be addressed in addition to the Health Maintenance Visit.   Wellness Visit: annual visit with health maintenance review and exam  HM: sees gyn; I reviewed her test results from her pt portal> normal mammo 05/2023 and nilm, neg HR HPV pap 12/2021. CRC screen is due w/ h/o nl cologuard 2022. Average risk/. Imms are up to date Chronic disease f/u and/or acute problem visit: (deemed necessary to be done in addition to the wellness visit): Discussed the use of AI scribe software for clinical note transcription with the patient, who gave verbal consent to proceed.  History of Present Illness   Anna Tran is a 48 year old female who presents with persistent heartburn following a recent respiratory illness.  She has been experiencing persistent heartburn since Saturday, following a respiratory illness that began on Wednesday of the previous week. The heartburn occurs after meals and spontaneously, such as last night when it started several hours after eating. She has a history of occasional heartburn but notes that it has never been this frequent or persistent. Antacids like Tums provide temporary relief, but symptoms recur. She was taking Betadurl, ibuprofen , and a generic sinus medication during her recent illness but stopped these medications once the illness resolved. No nausea, vomiting, coughing, or diarrhea associated with the heartburn.  She describes a respiratory illness that started on Wednesday of the previous week, characterized by typical cold symptoms. The respiratory symptoms have mostly resolved, although she still experiences some coughing, particularly at night.  She occasionally experiences pain in  the middle to upper back, described as a 'catching' sensation in her chest. This pain is exacerbated by deep breathing and is somewhat relieved by taking deep breaths or holding her breath. She suspects it might be a muscle spasm, as it feels like a muscular issue. She has a history of back pain and has received epidural injections in the past, which have been effective. She notes that her posture might contribute to her back issues, as she often slouches.  She has eczema on her ears, which recently worsened. She has been using clobetasol, a strong steroid cream, which has helped improve the condition over the past few days. She has tried various over-the-counter lotions without significant relief.       Assessment  1. Annual physical exam   2. Gastroesophageal reflux disease, unspecified whether esophagitis present   3. Acute midline thoracic back pain   4. Eczema of external auditory canal   5. Anxiety with flying   6. Screening for colorectal cancer      Plan  Female Wellness Visit: Age appropriate Health Maintenance and Prevention measures were discussed with patient. Included topics are cancer screening recommendations, ways to keep healthy (see AVS) including dietary and exercise recommendations, regular eye and dental care, use of seat belts, and avoidance of moderate alcohol use and tobacco use.  BMI: discussed patient's BMI and encouraged positive lifestyle modifications to help get to or maintain a target BMI. HM needs and immunizations were addressed and ordered. See below for orders. See HM and immunization section for updates. Routine labs and screening tests ordered including cmp, cbc and lipids where appropriate. Discussed recommendations regarding  Vit D and calcium supplementation (see AVS)  Chronic disease management visit and/or acute problem visit: Assessment and Plan    Gastroesophageal Reflux Disease (GERD) New onset of heartburn since last week, not associated with  meals. No alarming symptoms such as nausea, vomiting, coughing, or diarrhea. -Consider over-the-counter Pepcid or Prilosec for 1-2 weeks. -If symptoms persist, consider longer treatment duration of 4-6 weeks.  Musculoskeletal Back Pain Intermittent upper back pain, likely muscle spasm. No alarming symptoms such as shortness of breath or chest pain. -Consider heat, stretches, massage, and core exercises. -Use anti-inflammatories as needed for pain.  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 Temporary improvement with clobetasol. -change to triamcinolone  cream bid x 2 weeks -consider dermatology referral  Anxiety Anticipatory anxiety related to upcoming flight. -Provide a small supply of Xanax  for use as needed during flight.  General Health Maintenance / Followup Plans -Continue regular screenings as per guidelines. -Order basic lab work today. -Follow up on results and adjust treatment plan as necessary.       Follow up: 12 mo for cpe  Orders Placed This Encounter  Procedures   HM MAMMOGRAPHY   HM MAMMOGRAPHY   HM PAP SMEAR   Lipid panel   Comprehensive metabolic panel   CBC with Differential/Platelet   Cologuard   Meds ordered this encounter  Medications   ALPRAZolam  (XANAX ) 0.5 MG tablet    Sig: Take 1 tablet (0.5 mg total) by mouth daily as needed for anxiety.    Dispense:  15 tablet    Refill:  0   triamcinolone  cream (KENALOG ) 0.1 %    Sig: Apply 1 Application topically 2 (two) times daily. For 2 weeks, then as needed    Dispense:  45 g    Refill:  2      Body mass index is 26.03 kg/m. 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 06/23/2022    History of discectomy  05/06/2016    Episodic low back pain 12/17/2011    Health Maintenance  Topic Date Due   COVID-19 Vaccine (2 - Pfizer risk series) 09/16/2023 (Originally 06/19/2023)   Fecal DNA (Cologuard)  12/03/2023   MAMMOGRAM  06/21/2024   Cervical Cancer Screening (HPV/Pap Cotest)  12/28/2027   DTaP/Tdap/Td (3 - Td or Tdap) 06/23/2032   INFLUENZA VACCINE  Completed   Hepatitis C Screening  Completed   HIV Screening  Completed   HPV VACCINES  Aged Out   Immunization History  Administered Date(s) Administered   Influenza,inj,Quad PF,6+ Mos 08/02/2013, 05/22/2022, 05/29/2023   Pfizer Covid-19 Vaccine Bivalent Booster 42yrs & up 05/29/2023   Tdap 09/06/2011, 06/23/2022   We updated and reviewed the patient's past history in detail and it is documented below. Allergies: Patient is allergic to amoxicillin and sulfa antibiotics. Past Medical History Patient  has a past medical history of Chronic back pain, History of panic attacks, Hyperlipidemia, and Slow urinary stream. Past Surgical History Patient  has a past surgical history that includes Cystectomy (2008); Wisdom tooth extraction; Cesarean section (2010); Colonoscopy; Lumbar laminectomy/decompression microdiscectomy (Left, 05/06/2016); and Spine surgery (2017). Family History: Patient family history includes Anxiety disorder in her daughter; Arthritis in her mother;  Asthma in her father; Cancer in her paternal grandmother; Depression in her mother; Hyperlipidemia in her father and mother; Hypertension in her father; Stroke in her mother. Social History:  Patient  reports that she has never smoked. She has never used smokeless tobacco. She reports current alcohol use. She reports that she does not use drugs.  Review of Systems: Constitutional: negative for fever or malaise Ophthalmic: negative for photophobia, double vision or loss of vision Cardiovascular: negative for chest pain, dyspnea on exertion, or new LE swelling Respiratory: negative  for SOB or persistent cough Gastrointestinal: negative for abdominal pain, change in bowel habits or melena Genitourinary: negative for dysuria or gross hematuria, no abnormal uterine bleeding or disharge Musculoskeletal: negative for new gait disturbance or muscular weakness Integumentary: negative for new or persistent rashes, no breast lumps Neurological: negative for TIA or stroke symptoms Psychiatric: negative for SI or delusions Allergic/Immunologic: negative for hives  Patient Care Team    Relationship Specialty Notifications Start End  Jodie Lavern CROME, MD PCP - General Family Medicine  06/23/22   Ladena Alm PARAS, MD Consulting Physician Colon and Rectal Surgery  06/23/22   Kandyce Sor, MD Consulting Physician Obstetrics and Gynecology  06/23/22   Joane Artist RAMAN, MD Consulting Physician Family Medicine  06/23/22   Mavis Purchase, MD Consulting Physician Neurosurgery  06/23/22     Objective  Vitals: BP (!) 143/92   Pulse 82   Temp 98.1 F (36.7 C)   Ht 5' 5 (1.651 m)   Wt 156 lb 6.4 oz (70.9 kg)   SpO2 99%   BMI 26.03 kg/m  General:  Well developed, well nourished, no acute distress  Psych:  Alert and orientedx3,normal mood and affect HEENT:  Normocephalic, atraumatic, non-icteric sclera,  supple neck without adenopathy, mass or thyromegaly Cardiovascular:  Normal S1, S2, RRR without gallop, rub or murmur Respiratory:  Good breath sounds bilaterally, CTAB with normal respiratory effort Gastrointestinal: normal bowel sounds, soft, non-tender, no noted masses. No HSM MSK: extremities without edema, joints without erythema or swelling Neurologic:    Mental status is normal.  Gross motor and sensory exams are normal.  No tremor Skin: ears with eczematous changes externally  Commons side effects, risks, benefits, and alternatives for medications and treatment plan prescribed today were discussed, and the patient expressed understanding of the given instructions. Patient  is instructed to call or message via MyChart if he/she has any questions or concerns regarding our treatment plan. No barriers to understanding were identified. We discussed Red Flag symptoms and signs in detail. Patient expressed understanding regarding what to do in case of urgent or emergency type symptoms.  Medication list was reconciled, printed and provided to the patient in AVS. Patient instructions and summary information was reviewed with the patient as documented in the AVS. This note was prepared with assistance of Dragon voice recognition software. Occasional wrong-word or sound-a-like substitutions may have occurred due to the inherent limitations of voice recognition software

## 2023-08-31 NOTE — Patient Instructions (Signed)
 Please return in 12 months for your annual complete physical; please come fasting.   I will release your lab results to you on your MyChart account with further instructions. You may see the results before I do, but when I review them I will send you a message with my report or have my assistant call you if things need to be discussed. Please reply to my message with any questions. Thank you!   If you have any questions or concerns, please don't hesitate to send me a message via MyChart or call the office at [phone]. Thank you for visiting with us  today! It's our pleasure caring for you.   VISIT SUMMARY:  Today, we discussed your recent heartburn, back pain, eczema, and anxiety. We reviewed your symptoms and developed a plan to help manage each of these issues. Additionally, we discussed general health maintenance and ordered some routine lab work.  YOUR PLAN:  -GASTROESOPHAGEAL REFLUX DISEASE (GERD): GERD is a condition where stomach acid frequently flows back into the tube connecting your mouth and stomach, causing heartburn. We recommend trying over-the-counter medications like Pepcid or Prilosec for 1-2 weeks. If your symptoms persist, we may consider a longer treatment duration of 4-6 weeks.  -MUSCULOSKELETAL BACK PAIN: Your back pain is likely due to muscle spasms. We suggest using heat, doing stretches, getting massages, and performing core exercises. You can also use anti-inflammatory medications as needed for pain relief.  -ECZEMA: Eczema is a condition that makes your skin red and itchy. I have ordered a mild steroid cream to use twice daily for up to 2 weeks. You could consider seeing a dermatologist for further management strategies.  -ANXIETY: Anxiety is a feeling of worry or fear. We have provided a small supply of Xanax  for you to use as needed during your upcoming flight.  -GENERAL HEALTH MAINTENANCE: We discussed the importance of continuing regular screenings and ordered  basic lab work today. We will follow up on the results and adjust your treatment plan as necessary.  INSTRUCTIONS:  Please follow up with us  after completing the over-the-counter medication trial for your heartburn or sooner if your symptoms worsen. We will also contact you with the results of your lab work.

## 2023-09-01 ENCOUNTER — Encounter: Payer: Self-pay | Admitting: Family Medicine

## 2023-09-01 NOTE — Progress Notes (Signed)
 Labs reviewed.  The 10-year ASCVD risk score (Arnett DK, et al., 2019) is: 1.2%   Values used to calculate the score:     Age: 48 years     Sex: Female     Is Non-Hispanic African American: No     Diabetic: No     Tobacco smoker: No     Systolic Blood Pressure: 143 mmHg     Is BP treated: No     HDL Cholesterol: 58.6 mg/dL     Total Cholesterol: 199 mg/dL

## 2023-11-02 ENCOUNTER — Encounter: Payer: Self-pay | Admitting: Family Medicine

## 2023-11-02 LAB — COLOGUARD: COLOGUARD: NEGATIVE

## 2023-11-02 NOTE — Progress Notes (Signed)
Negative cologuard. Mc note sent

## 2023-12-07 ENCOUNTER — Ambulatory Visit: Admitting: Family Medicine

## 2023-12-07 ENCOUNTER — Encounter: Payer: Self-pay | Admitting: Family Medicine

## 2023-12-07 VITALS — BP 145/87 | HR 108 | Temp 98.4°F | Ht 65.0 in | Wt 153.2 lb

## 2023-12-07 DIAGNOSIS — B349 Viral infection, unspecified: Secondary | ICD-10-CM

## 2023-12-07 DIAGNOSIS — T39395A Adverse effect of other nonsteroidal anti-inflammatory drugs [NSAID], initial encounter: Secondary | ICD-10-CM

## 2023-12-07 DIAGNOSIS — J029 Acute pharyngitis, unspecified: Secondary | ICD-10-CM | POA: Diagnosis not present

## 2023-12-07 DIAGNOSIS — K2960 Other gastritis without bleeding: Secondary | ICD-10-CM

## 2023-12-07 DIAGNOSIS — R519 Headache, unspecified: Secondary | ICD-10-CM

## 2023-12-07 DIAGNOSIS — R051 Acute cough: Secondary | ICD-10-CM

## 2023-12-07 DIAGNOSIS — K645 Perianal venous thrombosis: Secondary | ICD-10-CM

## 2023-12-07 LAB — POC COVID19 BINAXNOW: SARS Coronavirus 2 Ag: NEGATIVE

## 2023-12-07 MED ORDER — OMEPRAZOLE 20 MG PO CPDR
20.0000 mg | DELAYED_RELEASE_CAPSULE | Freq: Every day | ORAL | 0 refills | Status: DC
Start: 2023-12-07 — End: 2024-02-17

## 2023-12-07 MED ORDER — KETOROLAC TROMETHAMINE 60 MG/2ML IM SOLN
60.0000 mg | Freq: Once | INTRAMUSCULAR | Status: AC
  Administered 2023-12-07: 60 mg via INTRAMUSCULAR

## 2023-12-07 MED ORDER — HYDROCODONE BIT-HOMATROP MBR 5-1.5 MG/5ML PO SOLN
5.0000 mL | Freq: Three times a day (TID) | ORAL | 0 refills | Status: DC | PRN
Start: 2023-12-07 — End: 2024-02-17

## 2023-12-07 MED ORDER — LIDOCAINE-HYDROCORTISONE ACE 3-0.5 % EX CREA: TOPICAL_CREAM | CUTANEOUS | 0 refills | Status: DC

## 2023-12-07 NOTE — Progress Notes (Signed)
 Subjective  CC:  Chief Complaint  Patient presents with   chest congestion    Some chest congestion since last week, cough, headaches, cold/hot temp    HPI: Anna Tran is a 48 y.o. female who presents to the office today to address the problems listed above in the chief complaint. Discussed the use of AI scribe software for clinical note transcription with the patient, who gave verbal consent to proceed.  History of Present Illness Anna Tran is a 48 year old female who presents with low back pain, chills, and a thrombosed hemorrhoid.  She initially experienced low back pain, for which she was taking muscle relaxers and 800 mg of ibuprofen . The back pain has since resolved.  Following the resolution of her back pain, she developed chills and felt extremely cold, particularly on the first night, accompanied by shaking. She has not had a ( but was on advil )  but has experienced hot and cold sensations. She describes congestion that is not localized to her nose or throat but feels like it is 'back there somewhere' in her head. This congestion has progressed to her chest, resulting in a persistent cough and severe headaches, which are atypical for her. She took an expired COVID test at home, which was negative. She reports mild chest discomfort at night, described as a burning sensation similar to being out in the cold, but not significant chest pain with breathing. No sob or doe. No gi sxs. Mild sore throat. No sick contacts  Recently, she developed stomach pain, which she suspects may be due to the ibuprofen . Midepigastric. She has since switched to acetaminophen  for headache relief, which has been effective.  She also reports a thrombosed hemorrhoid, which she describes as extremely painful and similar to a previous episode over a year ago. The area is swollen, and the pain is severe enough to prevent her from using the bathroom. Pain has been present for 5 days. No rectal bleeding.  Reviewed colorectal note from 12/2022 with same problem. Conservative care given w/ resolution of sxs.    Assessment  1. Pharyngitis with viral syndrome   2. Acute intractable headache, unspecified headache type   3. Acute cough   4. Thrombosed external hemorrhoid   5. NSAID induced gastritis      Plan  Assessment and Plan Assessment & Plan Thrombosed hemorrhoid Severe anal pain and swelling consistent with thrombosed hemorrhoid. Previous conservative management by colorectal specialist. Current significant discomfort and difficulty with bowel movements.  - Discussed conservative management: sitz baths, lidoncaine w/ hydrocortisone  cream topically, toradol  given in office for acute pain mgt. Hydrocodone  for at home pain mgt  Gastrointestinal pain due to NSAID use Stomach pain likely secondary to NSAID use for back pain. Switching to acetaminophen  alleviated headaches without further gastrointestinal discomfort. - Continue acetaminophen  for pain management. - Avoid NSAIDs to prevent further gastrointestinal irritation. -2-4 week course of omeprazole recommended  Acute viral syndrome: improving.  Symptoms include low back pain, chills, congestion, cough, and headaches. Negative expired COVID-19 test. No fever, possibly masked by regular ibuprofen  use. Mild nocturnal chest discomfort likely upper airway irritation. Suggestive of viral upper respiratory infection. - Continue symptomatic treatment with acetaminophen  for headaches. - Advised on supportive care measures such as hydration and rest. - hycodan cough syrup  Follow up: prn Orders Placed This Encounter  Procedures   POC COVID-19   Meds ordered this encounter  Medications   HYDROcodone  bit-homatropine (HYCODAN) 5-1.5 MG/5ML syrup    Sig:  Take 5 mLs by mouth every 8 (eight) hours as needed for cough.    Dispense:  120 mL    Refill:  0   Lidocaine -Hydrocortisone  Ace 3-0.5 % CREA    Sig: Use topically tid prn    Dispense:   85 g    Refill:  0   ketorolac  (TORADOL ) injection 60 mg   omeprazole (PRILOSEC) 20 MG capsule    Sig: Take 1 capsule (20 mg total) by mouth daily.    Dispense:  30 capsule    Refill:  0     I reviewed the patients updated PMH, FH, and SocHx.  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   Lidocaine -Hydrocortisone  Ace 3-0.5 % CREA Use topically tid prn   omeprazole (PRILOSEC) 20 MG capsule Take 1 capsule (20 mg total) by mouth daily.   Allergies: Patient is allergic to amoxicillin and sulfa antibiotics. Family History: Patient family history includes Anxiety disorder in her daughter; Arthritis in her mother; Asthma in her father; Cancer in her paternal grandmother; Depression in her mother; Hyperlipidemia in her father and mother; Hypertension in her father; Stroke in her mother. Social History:  Patient  reports that she has never smoked. She has never used smokeless tobacco. She reports current alcohol use. She reports that she does not use drugs.  Review of Systems: Constitutional: Negative for fever malaise or anorexia Cardiovascular: negative for chest pain Respiratory: negative for SOB or persistent cough Gastrointestinal: negative for abdominal pain  Objective  Vitals: BP (!) 145/87   Pulse (!) 108   Temp 98.4 F (36.9 C)   Ht 5\' 5"  (1.651 m)   Wt 153 lb 3.2 oz (69.5 kg)   SpO2 97%   BMI 25.49 kg/m  General: appears uncomfortable, diaphoretic , A&Ox3 HEENT: PEERL, conjunctiva normal, neck is , mildly erythematous OP,  Cardiovascular:  RRR without murmur or gallop.  Respiratory:  Good breath sounds  bilaterally, CTAB with normal respiratory effort Abd: midepigastrictpp w/o rebound guarding or masses Rectal: red swollen tender external hemorrhoid present Skin:  Warm, no rashes Office Visit on 12/07/2023  Component Date Value Ref Range Status   SARS Coronavirus 2 Ag 12/07/2023 Negative  Negative Final    Commons side effects, risks, benefits, and alternatives for medications and treatment plan prescribed today were discussed, and the patient expressed understanding of the given instructions. Patient is instructed to call or message via MyChart if he/she has any questions or concerns regarding our treatment plan. No barriers to understanding were identified. We discussed Red Flag symptoms and signs in detail. Patient expressed understanding regarding what to do in case of urgent or emergency type symptoms.  Medication list was reconciled, printed and provided to the patient in AVS. Patient instructions and summary information was reviewed with the patient as documented in the AVS. This note was prepared with assistance of Dragon voice recognition software. Occasional wrong-word or sound-a-like substitutions may have occurred due to the inherent limitations of voice recognition software

## 2023-12-07 NOTE — Patient Instructions (Signed)
 Please follow up if symptoms do not improve or as needed.     VISIT SUMMARY: During your visit, we discussed your recent symptoms, including low back pain, chills, congestion, cough, headaches, and a thrombosed hemorrhoid. Your back pain has resolved, but you are experiencing other symptoms that we addressed.  YOUR PLAN: -THROMBOSED HEMORRHOID: A thrombosed hemorrhoid is a swollen vein in the rectum that has formed a clot, causing severe pain and swelling.  I've ordered topical lidocaine  and steroid cream to help alleviate the pain.   -GASTROINTESTINAL PAIN DUE TO NSAID USE: Your stomach pain is likely due to the use of NSAIDs (like ibuprofen ) for your back pain. NSAIDs can irritate the stomach lining. You should continue using acetaminophen  for pain management and avoid NSAIDs to prevent further stomach issues.  -ACUTE UPPER RESPIRATORY INFECTION: An acute upper respiratory infection is a viral infection that affects the nose, throat, and airways. Your symptoms include chills, congestion, cough, and headaches. You should continue taking acetaminophen  for headaches and follow supportive care measures such as staying hydrated and getting plenty of rest.  INSTRUCTIONS: Please follow up if your symptoms worsen or do not improve. If your hemorrhoid pain becomes unmanageable, consider seeing a specialist. Avoid NSAIDs to prevent further stomach irritation, and continue using acetaminophen  as needed for pain. Stay hydrated and rest to help your body recover from the upper respiratory infection.                      Contains text generated by Abridge.                                 Contains text generated by Abridge.

## 2023-12-09 ENCOUNTER — Ambulatory Visit: Admitting: Family Medicine

## 2024-02-08 ENCOUNTER — Ambulatory Visit: Admitting: Internal Medicine

## 2024-02-17 ENCOUNTER — Encounter: Payer: Self-pay | Admitting: Internal Medicine

## 2024-02-17 ENCOUNTER — Ambulatory Visit: Payer: Self-pay | Admitting: Internal Medicine

## 2024-02-17 ENCOUNTER — Ambulatory Visit: Admitting: Internal Medicine

## 2024-02-17 ENCOUNTER — Other Ambulatory Visit (INDEPENDENT_AMBULATORY_CARE_PROVIDER_SITE_OTHER)

## 2024-02-17 VITALS — BP 122/88 | HR 95 | Ht 65.0 in | Wt 155.0 lb

## 2024-02-17 DIAGNOSIS — K625 Hemorrhage of anus and rectum: Secondary | ICD-10-CM

## 2024-02-17 DIAGNOSIS — Z9889 Other specified postprocedural states: Secondary | ICD-10-CM | POA: Diagnosis not present

## 2024-02-17 DIAGNOSIS — K605 Anorectal fistula, unspecified: Secondary | ICD-10-CM

## 2024-02-17 DIAGNOSIS — Z1211 Encounter for screening for malignant neoplasm of colon: Secondary | ICD-10-CM

## 2024-02-17 LAB — CBC WITH DIFFERENTIAL/PLATELET
Basophils Absolute: 0 K/uL (ref 0.0–0.1)
Basophils Relative: 0.4 % (ref 0.0–3.0)
Eosinophils Absolute: 0.2 K/uL (ref 0.0–0.7)
Eosinophils Relative: 3.6 % (ref 0.0–5.0)
HCT: 44.2 % (ref 36.0–46.0)
Hemoglobin: 14.8 g/dL (ref 12.0–15.0)
Lymphocytes Relative: 22.8 % (ref 12.0–46.0)
Lymphs Abs: 1.2 K/uL (ref 0.7–4.0)
MCHC: 33.5 g/dL (ref 30.0–36.0)
MCV: 87 fl (ref 78.0–100.0)
Monocytes Absolute: 0.3 K/uL (ref 0.1–1.0)
Monocytes Relative: 6.3 % (ref 3.0–12.0)
Neutro Abs: 3.6 K/uL (ref 1.4–7.7)
Neutrophils Relative %: 66.9 % (ref 43.0–77.0)
Platelets: 291 K/uL (ref 150.0–400.0)
RBC: 5.07 Mil/uL (ref 3.87–5.11)
RDW: 12.7 % (ref 11.5–15.5)
WBC: 5.4 K/uL (ref 4.0–10.5)

## 2024-02-17 LAB — TSH: TSH: 3.09 u[IU]/mL (ref 0.35–5.50)

## 2024-02-17 LAB — HIGH SENSITIVITY CRP: CRP, High Sensitivity: 2.01 mg/L (ref 0.000–5.000)

## 2024-02-17 NOTE — Progress Notes (Signed)
 Chief Complaint: Rectal bleeding  HPI : 48 year old female with history of panic attacks, external hemorrhoids, perianal fistula/abscess s/p fistulotomy in 12/2023 presents with rectal bleeding  Over the last 2 years she had 2 episodes of thrombosed external hemorrhoids. She sought out care after her hemorrhoids developed but was always seen too late for them to be treated surgically. After her last hemorrhoid flare, she developed a fistula. 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 Con with [facility]. She did not need a seton placed at that time. She was treated ciprofloxacin/flagyl for 7 days for some cellulitis surrounding a draining perianal abscess. She last had rectal bleeding 3 months ago. Denies rectal pain currently. She has soft stools 2-3 times per day. She does not feel like she empties fully when she has a BM. She has been to pelvic floor specialists in the past for this issue but her sensation of inadequate evacuation still persists. Weight has been stable. Denies N&V, dysphagia, ab pain. She does have acid reflux on occasion. She takes Tums PRN. Denies family history of GI issues. Her last colonoscopy in 2009 was normal.   Wt Readings from Last 3 Encounters:  02/17/24 155 lb (70.3 kg)  12/07/23 153 lb 3.2 oz (69.5 kg)  08/31/23 156 lb 6.4 oz (70.9 kg)    Past Medical History:  Diagnosis Date   Chronic back pain    herniated disc   History of panic attacks    not on any meds   Hyperlipidemia    hx of-took Fish Oil for a while. Last check over a yr ago and values were normal   Slow urinary stream      Past Surgical History:  Procedure Laterality Date   CESAREAN SECTION  2010   FTP   COLONOSCOPY     CYSTECTOMY  2008   cyst removed from groin   LUMBAR LAMINECTOMY/DECOMPRESSION MICRODISCECTOMY Left 05/06/2016   Procedure: LEFT LUMBAR FIVE-SACRALONE DISCECTOMY;  Surgeon: Reyes Budge, MD;  Location: [facility] OR;   Service: Neurosurgery;  Laterality: Left;  LEFT L5-S1 DISCECTOMY   SPINE SURGERY  2017   Discectomy   WISDOM TOOTH EXTRACTION     Family History  Problem Relation Age of Onset   Hyperlipidemia Mother    Depression Mother    Arthritis Mother    Stroke Mother    Hyperlipidemia Father    Asthma Father    Hypertension Father    Anxiety disorder Daughter    Cancer Paternal Grandmother        skin cancer   Anesthesia problems Neg Hx    Diabetes Neg Hx    Heart attack Neg Hx    Sudden death Neg Hx    Colon cancer Neg Hx    Breast cancer Neg Hx    Social History   Tobacco Use   Smoking status: Never   Smokeless tobacco: Never  Vaping Use   Vaping status: Never Used  Substance Use Topics   Alcohol use: Yes    Comment: on occasion   Drug use: No   Current Outpatient Medications  Medication Sig Dispense Refill   triamcinolone  cream (KENALOG ) 0.1 % Apply 1 Application topically 2 (two) times daily. For 2 weeks, then as needed 45 g 2   No current facility-administered medications for this visit.   Allergies  Allergen Reactions   Amoxicillin Rash   Sulfa Antibiotics Rash   Review of Systems: All systems reviewed and negative  except where noted in HPI.   Physical Exam: BP 122/88   Pulse 95   Ht 5' 5 (1.651 m)   Wt 155 lb (70.3 kg)   BMI 25.79 kg/m  Constitutional: Pleasant,well-developed, female in no acute distress. HEENT: Normocephalic and atraumatic. Conjunctivae are normal. No scleral icterus. Cardiovascular: Normal rate, regular rhythm.  Pulmonary/chest: Effort normal and breath sounds normal. No wheezing, rales or rhonchi. Abdominal: Soft, nondistended, nontender. Bowel sounds active throughout. There are no masses palpable. No hepatomegaly. Extremities: No edema Neurological: Alert and oriented to person place and time. Skin: Skin is warm and dry. No rashes noted. Psychiatric: Normal mood and affect. Behavior is normal.  Labs 08/2023: CBC and CMP nml.    Labs 10/2023: Cologuard negative  ASSESSMENT AND PLAN: Colon cancer screening History of rectal bleeding and rectal pain History of anorectal fistula s/p fistulotomy in 12/2023 Patient presents to establish care due to some rectal bleeding that was attributed to a thrombosed external hemorrhoid that was treated with conservative measures. She was noted to have a perianal fistula and abscess subsequently when seen by colorectal surgery, which was treated with a 7 day course of antibiotics and then fistulotomy surgery on 12/2023. She has been recovering from this surgery. She has a history of a remote colonoscopy but none in the last 16 years. Will check some labs today to make sure that her blood counts have not dropped excessively and to evaluate for any signs of inflammation or thyroid dysfunction. Perianal fistula/abscesses can sometimes be associated with inflammatory bowel disease. Patient is due for colon cancer screening so will get her scheduled for a colonoscopy in the near future to allow her some time to recover from her fistulotomy surgery. - Continue fiber supplement with Benefiber - Continue to hydrate well - Check CBC, CRP, TSH - Recall for a colonoscopy [facility] in 05/2024-06/2024 for colon cancer screening  Estefana Kidney, MD  I spent 51 minutes of time, including in depth chart review, independent review of results as outlined above, communicating results with the patient directly, face-to-face time with the patient, coordinating care, ordering studies and medications as appropriate, and documentation.

## 2024-02-17 NOTE — Patient Instructions (Signed)
 Your provider has requested that you go to the basement level for lab work before leaving today. Press B on the elevator. The lab is located at the first door on the left as you exit the elevator.  _______________________________________________________  If your blood pressure at your visit was 140/90 or greater, please contact your primary care physician to follow up on this.  _______________________________________________________  If you are age 49 or older, your body mass index should be between 23-30. Your Body mass index is 25.79 kg/m. If this is out of the aforementioned range listed, please consider follow up with your Primary Care Provider.  If you are age 20 or younger, your body mass index should be between 19-25. Your Body mass index is 25.79 kg/m. If this is out of the aformentioned range listed, please consider follow up with your Primary Care Provider.   ________________________________________________________  The [facility] providers would like to encourage you to use MYCHART to communicate with providers for non-urgent requests or questions.  Due to long hold times on the telephone, sending your provider a message by [facility] may be a faster and more efficient way to get a response.  Please allow 48 business hours for a response.  Please remember that this is for non-urgent requests.  _______________________________________________________  [facility] is using a team-based approach to care.  Your team is made up of your doctor and two to three APPS. Our APPS (Nurse Practitioners and Physician Assistants) work with your physician to ensure care continuity for you. They are fully qualified to address your health concerns and develop a treatment plan. They communicate directly with your gastroenterologist to care for you. Seeing the Advanced Practice Practitioners on your physician's team can help you by facilitating care more promptly, often allowing for earlier  appointments, access to diagnostic testing, procedures, and other specialty referrals.   Due to recent changes in healthcare laws, you may see the results of your imaging and laboratory studies on MyChart before your provider has had a chance to review them.  We understand that in some cases there may be results that are confusing or concerning to you. Not all laboratory results come back in the same time frame and the provider may be waiting for multiple results in order to interpret others.  Please give us  48 hours in order for your provider to thoroughly review all the results before contacting the office for clarification of your results.   Thank you for entrusting me with your care and for choosing [facility], Dr. Estefana Kidney

## 2024-09-05 ENCOUNTER — Encounter: Payer: 59 | Admitting: Family Medicine
